# Patient Record
Sex: Female | Born: 1985 | Hispanic: Yes | Marital: Married | Smoking: Never smoker
Health system: Southern US, Community
[De-identification: ages and names within clinical notes are randomized; demographics above are authoritative.]

## PROBLEM LIST (undated history)

## (undated) DIAGNOSIS — O139 Gestational [pregnancy-induced] hypertension without significant proteinuria, unspecified trimester: Secondary | ICD-10-CM

## (undated) HISTORY — DX: Gestational (pregnancy-induced) hypertension without significant proteinuria, unspecified trimester: O13.9

---

## 2018-02-06 NOTE — L&D Delivery Note (Signed)
OB/GYN Faculty Practice Delivery Note  Emily Wiley is a 33 y.o. [id number] s/p VBAC at [redacted]w[redacted]d. She was admitted for IOL for pre-eclampsia with severe features.   ROM: 1h 53m with clear fluid GBS Status: Negative  Maximum Maternal Temperature: 100.0 F    Labor Progress: . Patient arrived at fingertip dilation and was induced with foley bulb and pitocin. She was on pitocin for approximately 28 hours at the time of delivery.  Delivery Date/Time: 10/10/2018 at 2226 Delivery: Called to room and patient was complete and pushing. Head delivered in LOA position. No nuchal cord present. Shoulder and body delivered in usual fashion. Infant with spontaneous cry, placed on mother's abdomen, dried and stimulated. Cord clamped x 2 after 1-minute delay, and cut by father. Cord blood drawn. Placenta delivered spontaneously with gentle cord traction. Fundus firm with massage and Pitocin. Labia, perineum, vagina, and cervix inspected with 2nd degree perineal laceration extending down to capsule of EAS without any obvious tear. A crown stitch was placed over the EAS and the 2nd degree perineal laceration was repaired in the usual fashion. After delivery of infant uterine tone was intermittently poor and there was significant uterine bleeding. She was given TXA and 466mcg buccal misoprostol with good effect.   Placenta: 3v, intact Complications: OB Hemorrhage Lacerations: 2nd degree perineal, repaired EBL: 1122 Analgesia: epidural   Infant: APGAR (1 MIN): 9   APGAR (5 MINS): 9   APGAR (10 MINS):    Weight: pending  Augustin Coupe, MD/[facility] OB/GYN Fellow, Faculty Practice

## 2018-03-18 LAB — OB RESULTS CONSOLE ABO/RH: RH Type: POSITIVE

## 2018-03-18 LAB — CULTURE, OB URINE
Drug Screen, Urine: NEGATIVE
Urine Culture, OB: <5000

## 2018-03-18 LAB — OB RESULTS CONSOLE HGB/HCT, BLOOD
HCT: 37 (ref 29–41)
Hemoglobin: 12.5

## 2018-03-18 LAB — CYTOLOGY - PAP: Pap: ABNORMAL — AB

## 2018-03-18 LAB — OB RESULTS CONSOLE VARICELLA ZOSTER ANTIBODY, IGG: Varicella: IMMUNE

## 2018-03-18 LAB — OB RESULTS CONSOLE GC/CHLAMYDIA
Chlamydia: NEGATIVE
Gonorrhea: NEGATIVE

## 2018-03-18 LAB — OB RESULTS CONSOLE PLATELET COUNT: Platelets: 340

## 2018-03-18 LAB — OB RESULTS CONSOLE RUBELLA ANTIBODY, IGM: Rubella: IMMUNE

## 2018-03-18 LAB — OB RESULTS CONSOLE RPR: RPR: NONREACTIVE

## 2018-03-18 LAB — TRICHOMONAS (~~LOC~~): Trichomonas: NEGATIVE

## 2018-03-18 LAB — OB RESULTS CONSOLE ANTIBODY SCREEN: Antibody Screen: NEGATIVE

## 2018-03-18 LAB — GLUCOSE, 1 HOUR: Glucose 1 Hour: 67

## 2018-03-18 LAB — OB RESULTS CONSOLE HEPATITIS B SURFACE ANTIGEN: Hepatitis B Surface Ag: NEGATIVE

## 2018-03-19 ENCOUNTER — Other Ambulatory Visit (HOSPITAL_COMMUNITY): Payer: Self-pay | Admitting: Family

## 2018-03-19 DIAGNOSIS — Z3682 Encounter for antenatal screening for nuchal translucency: Secondary | ICD-10-CM

## 2018-03-29 ENCOUNTER — Encounter: Payer: Self-pay | Admitting: Family Medicine

## 2018-04-08 ENCOUNTER — Encounter: Payer: Self-pay | Admitting: Obstetrics & Gynecology

## 2018-04-08 ENCOUNTER — Ambulatory Visit (INDEPENDENT_AMBULATORY_CARE_PROVIDER_SITE_OTHER): Payer: Self-pay | Admitting: Obstetrics & Gynecology

## 2018-04-08 ENCOUNTER — Ambulatory Visit: Payer: Self-pay | Admitting: Clinical

## 2018-04-08 VITALS — BP 96/65 | HR 67 | Ht 60.0 in | Wt 132.7 lb

## 2018-04-08 DIAGNOSIS — Z3A13 13 weeks gestation of pregnancy: Secondary | ICD-10-CM

## 2018-04-08 DIAGNOSIS — Z8759 Personal history of other complications of pregnancy, childbirth and the puerperium: Secondary | ICD-10-CM

## 2018-04-08 DIAGNOSIS — O09292 Supervision of pregnancy with other poor reproductive or obstetric history, second trimester: Secondary | ICD-10-CM | POA: Insufficient documentation

## 2018-04-08 DIAGNOSIS — Z3480 Encounter for supervision of other normal pregnancy, unspecified trimester: Secondary | ICD-10-CM

## 2018-04-08 DIAGNOSIS — Z98891 History of uterine scar from previous surgery: Secondary | ICD-10-CM | POA: Insufficient documentation

## 2018-04-08 DIAGNOSIS — O0990 Supervision of high risk pregnancy, unspecified, unspecified trimester: Secondary | ICD-10-CM | POA: Insufficient documentation

## 2018-04-08 MED ORDER — ASPIRIN 81 MG PO CHEW: 81.0000 mg | CHEWABLE_TABLET | Freq: Every day | ORAL | 1 refills | Status: AC

## 2018-04-08 NOTE — Patient Instructions (Signed)
AREA PEDIATRIC/FAMILY PRACTICE PHYSICIANS  Central/[place]outheast [place] [phone]) . [facility] Davy Pique, MD; Gwendlyn Deutscher, MD; Walker Kehr, MD; Andria Frames, MD; McDiarmid, MD; Dutch Quint, MD; Nori Riis, MD; Mingo Amber, [address] [place], [place] [address] o [phone] o Mon-Fri 8:30-12:30, 1:30-5:00 o Providers come to see babies at [facility] o Accepting Medicaid . [place] [facility] [place] providers who accept newborns: Dorthy Cooler, MD; Orland Mustard, MD; Stephanie Acre, MD o [place], Rena Lara, [place] [address] o [phone] o Mon-Fri 8:00-5:30 o Babies seen by providers at [facility] o Does NOT accept Medicaid o Please call early in hospitalization for appointment (limited availability)  . Mustard [place], MD o [address], [place], [place] [address] o [phone] o Mon, Tue, Thur, Fri 8:30-5:00, Wed 10:00-7:00 (closed 1-2pm) o Babies seen by [facility] providers o Accepting Medicaid . [place], MD o [place], [place], [place] [address] o [phone] o Mon-Fri 8:30-5:00, Sat 8:30-12:00 o Provider comes to see babies at [place] Medicaid o Must have been referred from current patients or contacted office prior to delivery . [place] [facility] ([place] for [facility]) Franne Forts, MD; Tamera Punt, MD; Doneen Poisson, MD; Fatima Sanger, MD; Wynetta Emery, MD; Jess Barters, MD; Tami Ribas, MD; Herbert Moors, MD; Derrell Lolling, MD; Dorothyann Peng, MD; Lucious Groves, NP; Baldo Ash, NP o [address], [place], [place] [address] o [phone] o Mon, Tue, Thur, Fri 8:30-5:30, Wed 9:30-5:30, Sat 8:30-12:30 o Babies seen by [facility] providers o Accepting Medicaid o Only accepting infants of first-time parents or siblings of current patients [facility] discharge coordinator will make follow-up appointment . Baltazar Najjar o [place] [address],  [place], [place]  [address] o [phone]   Fax - [phone] . [facility] o [address], [place], [place]  [address] o Phone - [phone]   Fax [phone] . [place], [place], [place], [place]  [address] o [phone]  East/Northeast [place] [phone]) . [facility] Reginal Lutes, MD; Jacklynn Ganong, MD; Torrie Mayers, MD; MD; Rosana Hoes, MD; Servando Salina, MD; Rose Fillers, MD; Rex Kras, MD; Corinna Capra, MD; Volney American, MD; Trilby Drummer, MD; Janann Colonel, MD; Jimmye Norman, [place], [place], [place] [address] o [phone] o Mon-Fri 8:30-5:00 (extended evenings Mon-Thur as needed), Sat-Sun 10:00-1:00 o Providers come to see babies at [place] Medicaid for families of first-time babies and families with all children in the household age 72 and under. Must register with office prior to making appointment (M-F only). . [place], NP; Tomi Bamberger, MD; Redmond School, MD; [place], [place] [address] [place], [place] [address] o [phone] o Mon-Fri 8:00-5:00 o Babies seen by providers at [facility] o Does NOT accept Medicaid/Commercial Insurance Only . [facility])  Marnee Guarneri, MD; Drema Dallas, MD; Montine Circle, MD; Vilma Prader, MD; Vanita Panda, MD; Alfonso Ramus, MD; Ruthann Cancer, MD; Roxanne Mins, MD; Rosalva Ferron, MD; Polly Cobia, MD o [address] [place], [place] [address] o [phone] o Mon-Fri 8:30-5:30, Sat (Oct.-Mar.) 9:00-1:00 o Babies seen by providers at [place] [phone]) . [facility] Peers, MD; Suzan Slick, MD o [address], [place],  [address] o [phone] o Mon-Fri 8:30-5:00, Sat 8:30-12:00 o Providers come to see babies at [facility] o Does NOT accept Medicaid . [facility] Ricci Barker, PA; Mannie Stabile, MD; [place], [place]; Nancy Fetter, MD; Moreen Fowler, [place],  [place], [place] [address] o [phone] o Mon-Fri 8:00-5:00 o Babies seen by providers at [facility] o Does NOT accept Medicaid o Only accepting babies of parents who are patients o Please call early in hospitalization for appointment (limited availability) . [facility] Pediatricians Blanca Friend, MD; Sharlene Motts, MD; Rod Can, MD; Warner Mccreedy, NP; Sabra Heck, MD; Ermalinda Memos, MD; Sharlett Iles, NP; Aurther Loft, MD; Jerrye Beavers, MD; Marcello Moores, MD; Berline Lopes, MD; Charolette Forward, MD o [address] [place], [place], [place] [address] o [phone] o Mon-Fri 8:00-5:00, Sat 9:00-12:00 o Providers come to see babies at [facility] o Does NOT accept [facility] [phone]) . [place] [facility] [place] providers accepting new patients: Dayna Ramus, NP; [place], [place], [place], [place] [address] o [phone] o Mon-Fri 8:00-5:00 o Babies seen by providers at [facility] o Does NOT accept Medicaid o Only accepting babies of parents who are patients o Please call early in hospitalization for appointment (limited availability) . [facility] Oswaldo Conroy, MD; Sheran Lawless, MD o [address] [place], [place] [address] o [phone] (press 1 to schedule appointment) o Mon-Fri 8:00-5:00 o Providers come to see babies at [facility] o Does NOT accept Medicaid . [facility] Jodi Mourning, MD o [address], [place], [place] [address] o [phone] o Mon-Fri 8:30-5:00 (lunch 12:30-1:00), extended hours by appointment only Wed 5:00-6:30 o Babies seen by [facility] providers o Accepting Medicaid . [place] at Evalyn Casco, MD; [place], MD; Ethlyn Gallery, MD o [place], [place], [place] [address] o [phone] o Mon-Fri 8:00-5:00 o Babies seen by [facility] providers o Does NOT accept Medicaid . Therapist, music at [place], MD; Yong Channel, MD; [place], [place] [address] [address] [place], [place]  [address] o [phone] o Mon-Fri 8:00-5:00 o Babies seen by [facility] providers o Does NOT accept Medicaid . [place], [place]; [place], [place]; [place], NP; Albertina Parr, MD; Frederic Jericho, MD; Ronney Lion, MD; Carlos Levering, NP; Jerelene Redden, NP; Tomasita Crumble, NP; Ronelle Nigh, NP; Corinna Lines, MD; [place], MD o [place]., [place], [place] [address] o [phone] o Mon-Fri 8:30-5:00, Sat 10:00-1:00 o Providers come to see babies at [facility] o Does NOT accept Medicaid o Free prenatal information session Tuesdays at 4:45pm . [facility] Porfirio Oar, MD; [place], [place]; [place], [place]; [place], [place]., [place] [address] o [phone] o Mon-Fri 7:30-5:30 o Babies seen by [facility] providers . [facility] o [address] [place], [place], [place]  [address] o [phone]   Fax - [phone]  [place] [phone] & [phone]) . [place], MD o [address] [place], [place] [address] o [phone] o Mon-Thur 8:00-6:00 o Providers come to see babies at [place] Medicaid . [place], NP; Melford Aase, MD; [place], [place]; [place], [place]., [place], [place] [address] o [phone] o Mon-Thur 7:30-7:30, Fri 7:30-4:30 o Babies seen by [facility] providers o Accepting Medicaid . [facility] Nyra Jabs, MD; Cristino Martes, NP; Gertie Baron, MD o [address], [place], [place] [address] o [phone] o Mon-Fri 8:30-5:00, Sat 8:30-12:00 o Providers come to see babies at [place] Medicaid o Must have "Meet & Greet" appointment at office prior to delivery . [place] ([place]) o [place],  MD; Juleen China, MD; Clydene Laming, [place] [address], [place], [place] [address] o [phone] o Mon-Wed 8:00-6:00, Thur-Fri 8:00-5:00, Sat 9:00-12:00 o Providers come to  see babies at [facility] o Does NOT accept Medicaid o Only accepting siblings of current patients . [facility] [place], [place], [place], [place]  [address] o [phone]   Fax [phone] . [place] at [place] [address], [place], [place]  [address] o [phone]   Fax - Morton Gorman [phone] & [phone]) . Therapist, music at [place], DO; [place], [place]., [place], [place] [address] o [phone] o Mon-Fri 7:00-5:00 o Babies seen by [facility] providers o Does NOT accept Medicaid . [place], MD; [place], [place]; [place], [place], [place], [place] [address] o [phone] o Mon-Fri 8:00-5:00 o Babies seen by [facility] providers o Accepting Medicaid . [place], MD; [place], [place]; [place], NP; [place], [place], [place], [place] [address] o [phone] o Mon-Fri 8:00-5:00 o Babies seen by providers at [place] [phone]) . [facility] [place], [place] o [address] [place], [place] [address] o [phone] o Mon-Fri 8:00-5:00 o Babies seen by [facility] providers o Does NOT accept Medicaid o Limited availability, please call early in hospitalization to schedule follow-up . [facility] Leilani Merl, PA; Maisie Fus, MD; [place], MD; [place], [place]; Jeannine Kitten, MD; [place], [place] [facility] [address], [place], [place] [address] o [phone] o Mon-Fri 8:30-5:00, Sat 9:00-12:00 o Babies seen by providers at [facility] o Accepting Medicaid o Please register online then schedule online or call office o [email or web address] . [place] ([place] at  [place]) Kristian Covey, NP; Dwyane Dee, MD; Leonidas Romberg, PA o [address] [place], [place], [place] [address] o [phone] o Mon-Fri 8:00-5:00 o Babies seen by providers at [place] o Accepting Medicaid . [place] ([facility] [employer]) Dairl Ponder, MD; Rayvon Char, NP; Melina Modena, MD o [address] [place], [place], [place] [address] o [phone] o Mon-Fri 8:00-5:30, Sat&Sun by appointment (phones open at 8:30) o Babies seen by [facility] providers o Accepting Medicaid o Must be a first-time baby or sibling of current patient . [place], [address], [place], [place]  [address] o [phone]   Fax - [phone]  [place] [phone] & [phone]) . [place], [place]; [place], [place]; Benjamine Mola, MD; [place], [place]; Harrell Lark, MD o [address] [place], [place] [address] o [phone] o Mon-Thur 8:00-7:00, Fri 8:00-5:00, Sat 8:00-12:00, Sun 9:00-12:00 o Babies seen by [facility] providers o Accepting Medicaid . [facility] at [facility], MD; Ruthann Cancer, MD; [facility], MD o [address], [place], [place] [address] o [phone] o Mon-Thur 8:00-5:00 o Babies seen by providers at [facility] o Accepting Medicaid . [facility] at [place], MD; Coe-Goins, MD; Amedeo Plenty, MD; Bobby Rumpf, MD; List, MD; Lavonia Drafts, MD; Ruthann Cancer, MD; Selinda Eon, MD; Audie Box, MD; Jim Like, MD; Christie Nottingham, MD; Hubbard Hartshorn, MD; Modena Nunnery, MD o [place]., [place], [place]  [address] o [phone] o Mon-Fri 8:00-5:30, Sat (Oct.-Mar.) 9:00-1:00 o Babies seen by providers at [facility] o Accepting Medicaid o Must fill out new patient packet, available online at [email or web address] . [place] ([facility]) Barnabas Lister, NP; Kenton Kingfisher, NP; Claiborne Billings, NP; Rolla Plate, MD;  [place], [place]; Carola Rhine, MD; Tyron Russell, MD; Delia Chimes, NP o [address], [place], [place] [address] o [phone] o Mon-Thur 8:00-5:30, Fri 8:00-5:00 o Babies seen by providers at [place] [phone]) . [place], [place]; [place], MD; Dennard Schaumann, MD; [place], [place] o [address] [place], [place] [address] o [phone] o Mon-Fri 8:00-5:00 o Babies seen by providers at [place] [phone]) . [place] at [place], [place]; Olen Pel, MD; [place], [place], [place], [place] [address] o [phone] o Mon-Fri 8:00-5:00 o Babies seen by providers at [facility] o Does NOT accept Medicaid o Limited appointment availability, please call early in hospitalization  . Therapist, music at [place], [place]; [place], [address] [address] [place], [place] [address] o [phone] o Mon-Fri 8:00-5:00 o Babies seen by [facility] providers o Does NOT accept Medicaid . [facility] Su Grand, MD; Guy Sandifer, MD; [place], [place]; [place], [place] [address], [place], [place] [address] o [phone] o Mon-Fri 8:00-5:00 o After hours clinic [facility] [place], [place] [address]) [phone] Mon-Fri 5:00-8:00, Sat 12:00-6:00, Sun 10:00-4:00 o Babies seen by [facility] providers o Accepting Medicaid . [place] at [facility] o [address], [place], [place]  [address] o [phone]   Fax - [phone]  [place] [phone]) . Therapist, music at [facility], MD o [address] [place], [place], [place] [address] o [phone] o Mon-Fri 8:00-5:00 o Babies seen by [facility] providers o Does NOT accept Medicaid . [employer] ([place] [facility] [place]) Bing Neighbors, MD o [address], [place], [place]  [address] o [phone] o Mon-Thur 8:00-7:00, Fri 8:00-5:00, Sat 8:00-12:00 o Babies seen by providers at [facility] o Accepting Medicaid - but does not have vaccinations in office (must be received elsewhere) o Limited availability, please call early in hospitalization  [place] ([address]) . [place], MD o [address], [place] [address] o [phone]  Fax [phone]  Childbirth Education Options: [facility] Department Classes:  Childbirth education classes can help you get ready for a positive parenting experience. You can also meet other expectant parents and get free stuff for your baby. Each class runs for five weeks on the same night and costs $45 for the mother-to-be and her support person. Medicaid covers the cost if you are eligible. Call [phone] to register. [facility] Childbirth Education:  [phone] or [phone] or [email or web address]  Baby & Me Class: Discuss newborn & infant parenting and family adjustment issues with other new mothers in a relaxed environment. Each week brings a new speaker or baby-centered activity. We encourage new mothers to join [place] every Thursday at 11:00am. Babies birth until crawling. No registration or fee. Daddy [employer]: This course offers Dads-to-be the tools and knowledge needed to feel confident on their journey to becoming new fathers. Experienced dads, who have been trained as coaches, teach dads-to-be how to  hold, comfort, diaper, swaddle and play with their infant while being able to support the new mom as well. A class for men taught by men. $25/dad Big Brother/Big Sister: Let your children share in the joy of a new brother or sister in this special class designed just for them. Class includes discussion about how families care for babies: swaddling, holding, diapering, safety as well as how they can be helpful in their new role. This class is designed for  children ages 20 to 95, but any age is welcome. Please register each child individually. $5/child  Mom Talk: This mom-led group offers support and connection to mothers as they journey through the adjustments and struggles of that sometimes overwhelming first year after the birth of a child. Tuesdays at 10:00am and Thursdays at 6:00pm. Babies welcome. No registration or fee. Breastfeeding Support Group: This group is a mother-to-mother support circle where moms have the opportunity to share their breastfeeding experiences. A Lactation Consultant is present for questions and concerns. Meets each Tuesday at 11:00am. No fee or registration. Breastfeeding Your Baby: Learn what to expect in the first days of breastfeeding your newborn.  This class will help you feel more confident with the skills needed to begin your breastfeeding experience. Many new mothers are concerned about breastfeeding after leaving the hospital. This class will also address the most common fears and challenges about breastfeeding during the first few weeks, months and beyond. (call for fee) Comfort Techniques and Tour: This 2 hour interactive class will provide you the opportunity to learn & practice hands-on techniques that can help relieve some of the discomfort of labor and encourage your baby to rotate toward the best position for birth. You and your partner will be able to try a variety of labor positions with birth balls and rebozos as well as practice breathing, relaxation, and visualization techniques. A tour of the [facility] is included with this class. $20 per registrant and support person Childbirth Class- Weekend Option: This class is a Weekend version of our Birth & Baby series. It is designed for parents who have a difficult time fitting several weeks of classes into their schedule. It covers the care of your newborn and the basics of labor and childbirth. It also includes a [place]  [facility] and lunch. The class is held two consecutive days: beginning on Friday evening from 6:30 - 8:30 p.m. and the next day, Saturday from 9 a.m. - 4 p.m. (call for fee) Doren Custard Class: Interested in a waterbirth?  This informational class will help you discover whether waterbirth is the right fit for you. Education about waterbirth itself, supplies you would need and how to assemble your support team is what you can expect from this class. Some obstetrical practices require this class in order to pursue a waterbirth. (Not all obstetrical practices offer waterbirth-check with your healthcare provider.) Register only the expectant mom, but you are encouraged to bring your partner to class! Required if planning waterbirth, no fee. Infant/Child CPR: Parents, grandparents, babysitters, and friends learn Cardio-Pulmonary Resuscitation skills for infants and children. You will also learn how to treat both conscious and unconscious choking in infants and children. This Family & Friends program does not offer certification. Register each participant individually to ensure that enough mannequins are available. (Call for fee) Grandparent Love: Expecting a grandbaby? This class is for you! Learn about the latest infant care and safety recommendations and ways to support your own child as  he or she transitions into the parenting role. Taught by Registered Nurses who are childbirth instructors, but most importantly...they are grandmothers too! $10/person. Childbirth Class- Natural Childbirth: This series of 5 weekly classes is for expectant parents who want to learn and practice natural methods of coping with the process of labor and childbirth. Relaxation, breathing, massage, visualization, role of the partner, and helpful positioning are highlighted. Participants learn how to be confident in their body's ability to give birth. This class will empower and help parents make informed decisions about their own  care. Includes discussion that will help new parents transition into the immediate postpartum period. [facility] is included. We suggest taking this class between 25-32 weeks, but it's only a recommendation. $75 per registrant and one support person or $30 Medicaid. Childbirth Class- 3 week Series: This option of 3 weekly classes helps you and your labor partner prepare for childbirth. Newborn care, labor & birth, cesarean birth, pain management, and comfort techniques are discussed and a [place] [facility] is included. The class meets at the same time, on the same day of the week for 3 consecutive weeks beginning with the starting date you choose. $60 for registrant and one support person.  Marvelous Multiples: Expecting twins, triplets, or more? This class covers the differences in labor, birth, parenting, and breastfeeding issues that face multiples' parents. NICU tour is included. Led by a Certified Childbirth Educator who is the mother of twins. No fee. Caring for Baby: This class is for expectant and adoptive parents who want to learn and practice the most up-to-date newborn care for their babies. Focus is on birth through the first six weeks of life. Topics include feeding, bathing, diapering, crying, umbilical cord care, circumcision care and safe sleep. Parents learn to recognize symptoms of illness and when to call the pediatrician. Register only the mom-to-be and your partner or support person can plan to come with you! $10 per registrant and support person Childbirth Class- online option: This online class offers you the freedom to complete a Birth and Baby series in the comfort of your own home. The flexibility of this option allows you to review sections at your own pace, at times convenient to you and your support people. It includes additional video information, animations, quizzes, and extended activities. Get organized with helpful  eClass tools, checklists, and trackers. Once you register online for the class, you will receive an email within a few days to accept the invitation and begin the class when the time is right for you. The content will be available to you for 60 days. $60 for 60 days of online access for you and your support people.  Local Doulas: Natural Baby Doulas [email or web address] Tel: [phone] [email or web address] [employer] [phone] [email or web address] The Labor Hassell Halim  (also do waterbirth tub rental) [phone] [email or web address] Triad Birth Doula [phone] [email or web address] [email or web address].fi Sacred Rhythms  [phone] https://[email or web address]-[email or web address]/ [employer] (PADA) [email or web address].[email or web address]/ [place] Birth and Baby  [email or web address] Considering Waterbirth? Guide for patients at Center for [employer]  Why consider waterbirth?  . Gentle birth for babies . Less pain medicine used in labor . May allow for passive descent/less pushing . May reduce perineal tears  . More mobility and instinctive maternal position changes . Increased maternal relaxation . Reduced blood pressure in labor  Is waterbirth safe? What are the risks of infection, drowning or other complications?  . Infection: o Very low risk (3.7 % for  vs 4.8% for bed) o 7 in 8000 waterbirths with documented infection o Poorly cleaned equipment most common cause o Slightly lower group B strep transmission rate  . Drowning o Maternal:  - Very low risk   - Related to seizures or fainting o Newborn:  - Very low risk. No evidence of increased risk of respiratory problems in multiple large studies - Physiological protection from breathing under water - Avoid underwater birth if there are any fetal  complications - Once baby's head is out of the water, keep it out.  . Birth complication o Some reports of cord trauma, but risk decreased by bringing baby to surface gradually o No evidence of increased risk of shoulder dystocia. Mothers can usually change positions faster in water than in a bed, possibly aiding the maneuvers to free the shoulder.   You must attend a Waterbirth class at [facility]  3rd Wednesday of every month from 7-9pm  Free  Register by calling [phone] or online at [email or web address]  Bring us the certificate from the class to your prenatal appointment  Meet with a midwife at 36 weeks to see if you can still plan a waterbirth and to sign the consent.   Purchase or rent the following supplies:   Water Birth Pool (Birth Pool in a Box or [place] for instance)  (Tubs start ~$125)  Single-use disposable tub liner designed for your brand of tub  New garden hose labeled "lead-free", "suitable for drinking water",  Electric drain pump to remove water (We recommend 792 gallon per hour or greater pump.)   Separate garden hose to remove the dirty water  Fish net  Bathing suit top (optional)  Long-handled mirror (optional)  Places to purchase or rent supplies  Yourwaterbirth.com for tub purchases and supplies  Waterbirthsolutions.com for tub purchases and supplies  [employer] (www.thelaborladies.com) $275 for tub rental/set-up & take down/kit   [place] [employer] (http://www.padanc.org/MeetUs.htm) Information regarding doulas (labor support) who provide pool rentals  Our practice has a Birth Pool in a Box tub at the hospital that you may borrow on a first-come-first-served basis. It is your responsibility to to set up, clean and break down the tub. We cannot guarantee the availability of this tub in advance. You are responsible for bringing all accessories listed above. If you do not have all necessary supplies you cannot  have a waterbirth.    Things that would prevent you from having a waterbirth:  Premature, <37wks  Previous cesarean birth  Presence of thick meconium-stained fluid  Multiple gestation (Twins, triplets, etc.)  Uncontrolled diabetes or gestational diabetes requiring medication  Hypertension requiring medication or diagnosis of pre-eclampsia  Heavy vaginal bleeding  Non-reassuring fetal heart rate  Active infection (MRSA, etc.). Group B Strep is NOT a contraindication for  waterbirth.  If your labor has to be induced and induction method requires continuous  monitoring of the baby's heart rate  Other risks/issues identified by your obstetrical provider  Please remember that birth is unpredictable. Under certain unforeseeable circumstances your provider may advise against giving birth in the tub. These decisions will be made on a case-by-case basis and with the safety of you and your baby as our highest priority.     

## 2018-04-08 NOTE — Progress Notes (Signed)
   PRENATAL VISIT NOTE  Subjective:  Emily Wiley is a 33 y.o. [id number] at [redacted]w[redacted]d being seen today for ongoing prenatal care.  She is currently monitored for the following issues for this low-risk pregnancy and has Supervision of other normal pregnancy, antepartum on their problem list.  She is transferring from the health dept.  Patient reports no complaints.  Contractions: Irritability. Vag. Bleeding: None.  Movement: Present. Denies leaking of fluid.   The following portions of the patient's history were reviewed and updated as appropriate: allergies, current medications, past family history, past medical history, past social history, past surgical history and problem list. Problem list updated.  Objective:   Vitals:   04/08/18 1525 04/08/18 1529  BP: 96/65   Pulse: 67   Weight: 132 lb 11.2 oz (60.2 kg)   Height:  5' (1.524 m)    Fetal Status: Fetal Heart Rate (bpm): 145   Movement: Present     General:  Alert, oriented and cooperative. Patient is in no acute distress.  Skin: Skin is warm and dry. No rash noted.   Cardiovascular: Normal heart rate noted  Respiratory: Normal respiratory effort, no problems with respiration noted  Abdomen: Soft, gravid, appropriate for gestational age.  Pain/Pressure: Present     Pelvic: Cervical exam deferred        Extremities: Normal range of motion.  Edema: None  Mental Status: Normal mood and affect. Normal behavior. Normal judgment and thought content.   Assessment and Plan:  Pregnancy: [id number] at [redacted]w[redacted]d  1. Supervision of other normal pregnancy, antepartum  - [place] MFM OB COMP + 14 WK; Future 2. H/O PLTCS for failed IOL at 36 weeks for pre eclampsia in [place] - start baby asa  Preterm labor symptoms and general obstetric precautions including but not limited to vaginal bleeding, contractions, leaking of fluid and fetal movement were reviewed in detail with the patient. Please refer to After Visit Summary for other counseling  recommendations.  No follow-ups on file.  Future Appointments  Date Time Provider Department Center  04/11/2018  8:00 AM WH-MFC [place] 3 WH-MFCUS MFC-US  04/11/2018  9:00 AM WH-MFC LAB WH-MFC MFC-US  05/20/2018  8:45 AM WH-MFC [place] 2 WH-MFCUS MFC-US    Allie Bossier, MD

## 2018-04-08 NOTE — BH Specialist Note (Signed)
Integrated Behavioral Health Initial Visit  MRN: [id number] Name: Emily Wiley  Number of Integrated Behavioral Health Clinician visits:: 1/6 Session Start time: 3:22 Session End time: 3:37 Total time: 15 minutes  Type of Service: Integrated Behavioral Health- Individual/Family Interpretor:Yes.   Interpretor Name and Language:  Spanish   Warm Hand Off Completed.       SUBJECTIVE: Emily Wiley is a 33 y.o. female accompanied by n/a Patient was referred by Nicholaus Bloom, MD for Initial OB introduction to integrated behavioral health services  Patient reports the following symptoms/concerns: Pt states her primary symptom today is feeling nausea in pregnancy; no other concern today Duration of problem: Current pregnancy; Severity of problem: mild  OBJECTIVE: Mood: Normal and Affect: Appropriate Risk of harm to self or others: No plan to harm self or others  LIFE CONTEXT: Family and Social: Pt lives with her husband and 9yo School/Work: Pt works full-time second shift hours Self-Care: - Life Changes: Current pregnancy  GOALS ADDRESSED: Patient will: 1. Increase knowledge and/or ability of: healthy habits  INTERVENTIONS: Interventions utilized: Psychoeducation and/or Health Education  Standardized Assessments completed: GAD-7 and PHQ 9  ASSESSMENT: Patient currently experiencing Supervision of other normal pregnancy, antepartum   Patient may benefit from Initial OB introduction to integrated behavioral health services .  PLAN: 1. Follow up with behavioral health clinician on : As needed 2. Behavioral recommendations:  -Take prenatal vitamin daily, as recommended by medical provider 3. Referral(s): Integrated [employer] (In Clinic) 4. "From scale of 1-10, how likely are you to follow plan?": 10  Deziree Mokry C Zehava Turski, LCSW

## 2018-04-09 ENCOUNTER — Encounter: Payer: Self-pay | Admitting: *Deleted

## 2018-04-11 ENCOUNTER — Encounter (HOSPITAL_COMMUNITY): Payer: Self-pay

## 2018-04-11 ENCOUNTER — Other Ambulatory Visit (HOSPITAL_COMMUNITY): Payer: Self-pay

## 2018-04-15 ENCOUNTER — Ambulatory Visit (INDEPENDENT_AMBULATORY_CARE_PROVIDER_SITE_OTHER): Payer: Self-pay | Admitting: *Deleted

## 2018-04-15 DIAGNOSIS — O09292 Supervision of pregnancy with other poor reproductive or obstetric history, second trimester: Secondary | ICD-10-CM

## 2018-04-15 NOTE — Progress Notes (Signed)
Per schedule here for 17p injection. She is only 14 weeks . Reviewed chart and discussed with lead RN Marylynn Pearson, RN does not meet criteria for 17p because per provider note has hx PTD secondary to IOL for preeclampsia at 36 weeks. Informed patient to keep ob appt as scheduled. She also asked what she could take for cold and I informed her of meds safe for cold in pregnancy.  Linda,RN

## 2018-04-19 NOTE — Progress Notes (Signed)
Patient ID: Emily Wiley, female   DOB: [birth year], 33 y.o.   MRN: [id number] I have reviewed the chart and agree with nursing staff's documentation of this patient's encounter.  Scheryl Darter, MD 04/19/2018 10:43 AM

## 2018-04-22 ENCOUNTER — Ambulatory Visit: Payer: Self-pay

## 2018-04-26 ENCOUNTER — Encounter: Payer: Self-pay | Admitting: *Deleted

## 2018-04-29 ENCOUNTER — Encounter: Payer: Self-pay | Admitting: *Deleted

## 2018-04-29 ENCOUNTER — Ambulatory Visit: Payer: Self-pay

## 2018-05-06 ENCOUNTER — Telehealth: Payer: Self-pay | Admitting: Obstetrics and Gynecology

## 2018-05-06 NOTE — Telephone Encounter (Signed)
Called the patient with the interupter ID [phone]. Informed the patient of the appointment restrictions due to the COVID19.

## 2018-05-07 ENCOUNTER — Other Ambulatory Visit: Payer: Self-pay

## 2018-05-07 ENCOUNTER — Ambulatory Visit (INDEPENDENT_AMBULATORY_CARE_PROVIDER_SITE_OTHER): Payer: Self-pay | Admitting: Family Medicine

## 2018-05-07 VITALS — BP 114/76 | HR 69 | Temp 98.0°F | Wt 131.4 lb

## 2018-05-07 DIAGNOSIS — Z3A17 17 weeks gestation of pregnancy: Secondary | ICD-10-CM

## 2018-05-07 DIAGNOSIS — O09292 Supervision of pregnancy with other poor reproductive or obstetric history, second trimester: Secondary | ICD-10-CM

## 2018-05-07 DIAGNOSIS — Z8759 Personal history of other complications of pregnancy, childbirth and the puerperium: Secondary | ICD-10-CM

## 2018-05-07 DIAGNOSIS — Z98891 History of uterine scar from previous surgery: Secondary | ICD-10-CM

## 2018-05-07 DIAGNOSIS — Z3480 Encounter for supervision of other normal pregnancy, unspecified trimester: Secondary | ICD-10-CM

## 2018-05-07 LAB — POCT URINALYSIS DIP (DEVICE)
Bilirubin Urine: NEGATIVE
Glucose, UA: NEGATIVE mg/dL
Hgb urine dipstick: NEGATIVE
Ketones, ur: NEGATIVE mg/dL
Leukocytes,Ua: NEGATIVE
Nitrite: NEGATIVE
PROTEIN: NEGATIVE mg/dL
Specific Gravity, Urine: 1.025 (ref 1.005–1.030)
Urobilinogen, UA: 0.2 mg/dL (ref 0.0–1.0)
pH: 7 (ref 5.0–8.0)

## 2018-05-07 NOTE — Progress Notes (Signed)
Pt does not have access to BP cuff at home and received non-Baby Scripts cuff during her visit today.  Pt set up for Baby-scripts optimization but d/t difficulty with the baby-scripts website, this did not occur until after her visit was complete.  Pt did receive full instructions on how to sign up via email and download the app to which she verbalized understanding. Eda used to interpret.

## 2018-05-07 NOTE — Progress Notes (Signed)
   PRENATAL VISIT NOTE  Subjective:  Emily Wiley is a 33 y.o. [id number] at [redacted]w[redacted]d being seen today for ongoing prenatal care.  She is currently monitored for the following issues for this high-risk pregnancy and has Supervision of other normal pregnancy, antepartum; History of cesarean delivery; Hx of preeclampsia, prior pregnancy, currently pregnant, second trimester; and History of preterm premature rupture of membranes (PPROM) on their problem list.  Patient reports bilateral hip pain.  Contractions: Not present. Vag. Bleeding: None.  Movement: Present. Denies leaking of fluid.   The following portions of the patient's history were reviewed and updated as appropriate: allergies, current medications, past family history, past medical history, past social history, past surgical history and problem list.   Objective:   Vitals:   05/07/18 0946  BP: 114/76  Pulse: 69  Temp: 98 F (36.7 C)  Weight: 131 lb 6.4 oz (59.6 kg)    Fetal Status: Fetal Heart Rate (bpm): 143   Movement: Present     General:  Alert, oriented and cooperative. Patient is in no acute distress.  Skin: Skin is warm and dry. No rash noted.   Cardiovascular: Normal heart rate noted  Respiratory: Normal respiratory effort, no problems with respiration noted  Abdomen: Soft, gravid, appropriate for gestational age.  Pain/Pressure: Present     Pelvic: Cervical exam deferred        Extremities: Normal range of motion.  Edema: None  Mental Status: Normal mood and affect. Normal behavior. Normal judgment and thought content.   Assessment and Plan:  Pregnancy: [id number] at [redacted]w[redacted]d  1. Supervision of other normal pregnancy, antepartum FHT and FH normal, [place] scheduled   2. Hx of preeclampsia, prior pregnancy, currently pregnant, second trimester Taking ASA  3. History of cesarean delivery  4. History of preterm premature rupture of membranes (PPROM) Discussed Makena, that once highly recommended, but recent studies  show that may not be as effective. Patient declined makena at this point.   Preterm labor symptoms and general obstetric precautions including but not limited to vaginal bleeding, contractions, leaking of fluid and fetal movement were reviewed in detail with the patient. Please refer to After Visit Summary for other counseling recommendations.   No follow-ups on file.  No future appointments.  Levie Heritage, DO

## 2018-05-20 ENCOUNTER — Other Ambulatory Visit (HOSPITAL_COMMUNITY): Payer: Self-pay

## 2018-06-06 ENCOUNTER — Encounter: Payer: Self-pay | Admitting: Obstetrics & Gynecology

## 2018-06-06 ENCOUNTER — Ambulatory Visit (INDEPENDENT_AMBULATORY_CARE_PROVIDER_SITE_OTHER): Payer: Self-pay | Admitting: Obstetrics & Gynecology

## 2018-06-06 ENCOUNTER — Other Ambulatory Visit: Payer: Self-pay

## 2018-06-06 DIAGNOSIS — Z3A21 21 weeks gestation of pregnancy: Secondary | ICD-10-CM

## 2018-06-06 DIAGNOSIS — Z3482 Encounter for supervision of other normal pregnancy, second trimester: Secondary | ICD-10-CM

## 2018-06-06 DIAGNOSIS — Z3480 Encounter for supervision of other normal pregnancy, unspecified trimester: Secondary | ICD-10-CM

## 2018-06-06 NOTE — Progress Notes (Signed)
Pt. Has BP Cuff, could not take BP due to being at work. Advised to take & record in BRx when she gets home, pt verbalized understanding.

## 2018-06-06 NOTE — Progress Notes (Signed)
Pt stated that she was not able to use Webex because she was at work & [employer] on work computer, nor her phone. So appointment was thru telephone w/ live Interpreter.

## 2018-06-06 NOTE — Progress Notes (Signed)
   TELEHEALTH VIRTUAL OBSTETRICS VISIT ENCOUNTER NOTE  I connected with Emily Jillienne on 06/06/18 at  1:15 PM EDT by telephone at home and verified that I am speaking with the correct person using two identifiers.   I discussed the limitations, risks, security and privacy concerns of performing an evaluation and management service by telephone and the availability of in person appointments. I also discussed with the patient that there may be a patient responsible charge related to this service. The patient expressed understanding and agreed to proceed.  Subjective:  Emily Wiley is a 33 y.o. G3P1011 at [redacted]w[redacted]d being followed for ongoing prenatal care.  She is currently monitored for the following issues for this high-risk pregnancy and has Supervision of other normal pregnancy, antepartum; History of cesarean delivery; Hx of preeclampsia, prior pregnancy, currently pregnant, second trimester; and History of preterm premature rupture of membranes (PPROM) on their problem list.  Patient reports no complaints. Reports fetal movement. Denies any contractions, bleeding or leaking of fluid.   The following portions of the patient's history were reviewed and updated as appropriate: allergies, current medications, past family history, past medical history, past social history, past surgical history and problem list.   Objective:   General:  Alert, oriented and cooperative.   Mental Status: Normal mood and affect perceived. Normal judgment and thought content.  Rest of physical exam deferred due to type of encounter  Assessment and Plan:  Pregnancy: G3P1011 at [redacted]w[redacted]d 1. Supervision of other normal pregnancy, antepartum   Preterm labor symptoms and general obstetric precautions including but not limited to vaginal bleeding, contractions, leaking of fluid and fetal movement were reviewed in detail with the patient.  I discussed the assessment and treatment plan with the patient. The  patient was provided an opportunity to ask questions and all were answered. The patient agreed with the plan and demonstrated an understanding of the instructions. The patient was advised to call back or seek an in-person office evaluation/go to [facility]U at [facility] for any urgent or concerning symptoms. Please refer to After Visit Summary for other counseling recommendations.   I provided 10 minutes of non-face-to-face time during this encounter. Interpreter assisted with the call  Return in about 6 weeks (around 07/18/2018) for 2 hr.  No future appointments.  Scheryl Darter, MD Center for [facility], [facility]

## 2018-06-06 NOTE — Patient Instructions (Signed)
Segundo trimestre de embarazo Second Trimester of Pregnancy  El segundo trimestre va desde la semana14 hasta la 27 (desde el mes 4 hasta el 6). Este suele ser el momento en el que mejor se siente. En general, las nuseas matutinas han disminuido o han desaparecido completamente. Tendr ms energa y podr aumentarle el apetito. El beb en gestacin se desarrolla rpidamente. Hacia el final del sexto mes, el beb mide aproximadamente 9 pulgadas (23 cm) y pesa alrededor de 1 libras (700 g). Es probable que sienta al beb moverse entre las 18 y 20 semanas del embarazo. Siga estas indicaciones en su casa: Medicamentos  Tome los medicamentos de venta libre y los recetados solamente como se lo haya indicado el mdico. Algunos medicamentos son seguros para tomar durante el embarazo y otros no lo son.  Tome vitaminas prenatales que contengan por lo menos 600microgramos (?g) de cido flico.  Si tiene dificultad para mover el intestino (estreimiento), tome un medicamento para ablandar las heces (laxante) si su mdico se lo autoriza. Comida y bebida   Ingiera alimentos saludables de manera regular.  No coma carne cruda ni quesos sin cocinar.  Si obtiene poca cantidad de calcio de los alimentos que ingiere, consulte a su mdico sobre la posibilidad de tomar un suplemento diario de calcio.  Evite el consumo de alimentos ricos en grasas y azcares, como los alimentos fritos y los dulces.  Si tiene malestar estomacal (nuseas) o devuelve (vomita): ? Ingiera 4 o 5comidas pequeas por da en lugar de 3abundantes. ? Intente comer algunas galletitas saladas. ? Beba lquidos entre las comidas, en lugar de hacerlo durante estas.  Para evitar el estreimiento: ? Consuma alimentos ricos en fibra, como frutas y verduras frescas, cereales integrales y frijoles. ? Beba suficiente lquido para mantener el pis (orina) claro o de color amarillo plido. Actividad  Haga ejercicios solamente como se lo haya  indicado el mdico. Interrumpa la actividad fsica si comienza a tener calambres.  No haga ejercicio si hace demasiado calor, hay demasiada humedad o se encuentra en un lugar de mucha altura (altitud alta).  Evite levantar pesos excesivos.  Use zapatos con tacones bajos. Mantenga una buena postura al sentarse y pararse.  Puede continuar teniendo relaciones sexuales, a menos que el mdico le indique lo contrario. Alivio del dolor y del malestar  Use un sostn que le brinde buen soporte si sus mamas estn sensibles.  Dese baos de asiento con agua tibia para aliviar el dolor o las molestias causadas por las hemorroides. Use una crema para las hemorroides si el mdico la autoriza.  Descanse con las piernas elevadas si tiene calambres o dolor de cintura.  Si desarrolla venas hinchadas y abultadas (vrices) en las piernas: ? Use medias de compresin o medias de descanso como se lo haya indicado el mdico. ? Levante (eleve) los pies durante 15minutos, 3 o 4veces por da. ? Limite el consumo de sal en sus alimentos. Cuidado prenatal  Escriba sus preguntas. Llvelas cuando concurra a las visitas prenatales.  Concurra a todas las visitas prenatales como se lo haya indicado el mdico. Esto es importante. Seguridad  Colquese el cinturn de seguridad cuando conduzca.  Haga una lista de los nmeros de telfono de emergencia, que incluya los nmeros de telfono de familiares, amigos, el hospital, as como los departamentos de polica y bomberos. Instrucciones generales  Consulte a su mdico sobre los alimentos que debe comer o pdale que la ayude a encontrar a quien pueda aconsejarla si necesita ese servicio.    Consulte a su mdico acerca de dnde se dictan clases prenatales cerca de donde vive. Comience las clases antes del mes 6 de embarazo.  No se d baos de inmersin en agua caliente, baos turcos ni saunas.  No se haga duchas vaginales ni use tampones o toallas higinicas perfumadas.   No mantenga las piernas cruzadas durante mucho tiempo.  Vaya al dentista si an no lo hizo. Use un cepillo de cerdas suaves para cepillarse los dientes. Psese el hilo dental suavemente.  No fume, no consuma hierbas ni beba alcohol. No tome frmacos que el mdico no haya autorizado.  No consuma ningn producto que contenga nicotina o tabaco, como cigarrillos y cigarrillos electrnicos. Si necesita ayuda para dejar de fumar, consulte al mdico.  Evite el contacto con las bandejas sanitarias de los gatos y la tierra que estos animales usan. Estos elementos contienen bacterias que pueden causar defectos congnitos al beb y la posible prdida del beb (aborto espontneo) o la muerte fetal. Comunquese con un mdico si:  Tiene clicos leves o siente presin en la parte baja del vientre.  Tiene dolor al hacer pis (orinar).  Advierte un lquido con olor ftido que proviene de la vagina.  Tiene malestar estomacal (nuseas), devuelve (vomita) o tiene deposiciones acuosas (diarrea).  Sufre un dolor persistente en el abdomen.  Siente mareos. Solicite ayuda de inmediato si:  Tiene fiebre.  Tiene una prdida de lquido por la vagina.  Tiene sangrado o pequeas prdidas vaginales.  Siente dolor intenso o clicos en el abdomen.  Sube o baja de peso rpidamente.  Tiene dificultades para recuperar el aliento y siente dolor en el pecho.  Sbitamente se le hinchan mucho el rostro, las manos, los tobillos, los pies o las piernas.  No ha sentido los movimientos del beb durante una hora.  Siente un dolor de cabeza intenso que no se alivia al tomar medicamentos.  Tiene dificultad para ver. Resumen  El segundo trimestre va desde la semana14 hasta la 27, desde el mes 4 hasta el 6. Este suele ser el momento en el que mejor se siente.  Para cuidarse y cuidar a su beb en gestacin, debe comer alimentos saludables, tomar medicamentos solamente si su mdico le indica que lo haga y hacer  actividades que sean seguras para usted y su beb.  Llame al mdico si se enferma o si nota algo inusual acerca de su embarazo. Tambin llame al mdico si necesita ayuda para saber qu alimentos debe comer o si quiere saber qu actividades puede realizar de forma segura. Esta informacin no tiene como fin reemplazar el consejo del mdico. Asegrese de hacerle al mdico cualquier pregunta que tenga. Document Released: 09/25/2012 Document Revised: 10/18/2016 Document Reviewed: 10/18/2016 Elsevier Interactive Patient Education  2019 Elsevier Inc.  

## 2018-06-14 ENCOUNTER — Ambulatory Visit (HOSPITAL_COMMUNITY): Payer: Self-pay

## 2018-06-18 ENCOUNTER — Other Ambulatory Visit (HOSPITAL_COMMUNITY): Payer: Self-pay | Admitting: *Deleted

## 2018-06-18 ENCOUNTER — Other Ambulatory Visit: Payer: Self-pay | Admitting: Obstetrics & Gynecology

## 2018-06-18 ENCOUNTER — Encounter (HOSPITAL_COMMUNITY): Payer: Self-pay

## 2018-06-18 ENCOUNTER — Ambulatory Visit (HOSPITAL_COMMUNITY): Payer: Self-pay | Admitting: *Deleted

## 2018-06-18 ENCOUNTER — Ambulatory Visit (HOSPITAL_COMMUNITY)
Admission: RE | Admit: 2018-06-18 | Discharge: 2018-06-18 | Disposition: A | Discharge status: Home / Self Care | Payer: Self-pay | Source: Ambulatory Visit | Attending: Obstetrics and Gynecology | Admitting: Obstetrics and Gynecology

## 2018-06-18 ENCOUNTER — Other Ambulatory Visit: Payer: Self-pay

## 2018-06-18 VITALS — BP 118/56 | HR 86 | Temp 98.5°F

## 2018-06-18 DIAGNOSIS — Z3480 Encounter for supervision of other normal pregnancy, unspecified trimester: Secondary | ICD-10-CM

## 2018-06-18 DIAGNOSIS — O365990 Maternal care for other known or suspected poor fetal growth, unspecified trimester, not applicable or unspecified: Secondary | ICD-10-CM

## 2018-06-18 DIAGNOSIS — O0990 Supervision of high risk pregnancy, unspecified, unspecified trimester: Secondary | ICD-10-CM

## 2018-06-18 DIAGNOSIS — O09292 Supervision of pregnancy with other poor reproductive or obstetric history, second trimester: Secondary | ICD-10-CM

## 2018-06-18 DIAGNOSIS — O365920 Maternal care for other known or suspected poor fetal growth, second trimester, not applicable or unspecified: Secondary | ICD-10-CM

## 2018-06-18 DIAGNOSIS — O34219 Maternal care for unspecified type scar from previous cesarean delivery: Secondary | ICD-10-CM

## 2018-06-18 DIAGNOSIS — Z3A23 23 weeks gestation of pregnancy: Secondary | ICD-10-CM

## 2018-06-25 ENCOUNTER — Other Ambulatory Visit (HOSPITAL_COMMUNITY): Payer: Self-pay | Admitting: *Deleted

## 2018-06-25 ENCOUNTER — Other Ambulatory Visit: Payer: Self-pay

## 2018-06-25 ENCOUNTER — Ambulatory Visit (HOSPITAL_COMMUNITY)
Admission: RE | Admit: 2018-06-25 | Discharge: 2018-06-25 | Disposition: A | Discharge status: Home / Self Care | Payer: Self-pay | Source: Ambulatory Visit | Attending: Obstetrics and Gynecology | Admitting: Obstetrics and Gynecology

## 2018-06-25 ENCOUNTER — Ambulatory Visit (HOSPITAL_COMMUNITY): Payer: Self-pay | Admitting: *Deleted

## 2018-06-25 ENCOUNTER — Encounter (HOSPITAL_COMMUNITY): Payer: Self-pay

## 2018-06-25 VITALS — BP 114/67 | HR 79 | Temp 98.4°F

## 2018-06-25 DIAGNOSIS — O09292 Supervision of pregnancy with other poor reproductive or obstetric history, second trimester: Secondary | ICD-10-CM

## 2018-06-25 DIAGNOSIS — O365990 Maternal care for other known or suspected poor fetal growth, unspecified trimester, not applicable or unspecified: Secondary | ICD-10-CM | POA: Insufficient documentation

## 2018-06-25 DIAGNOSIS — O0990 Supervision of high risk pregnancy, unspecified, unspecified trimester: Secondary | ICD-10-CM | POA: Insufficient documentation

## 2018-06-25 DIAGNOSIS — Z3A24 24 weeks gestation of pregnancy: Secondary | ICD-10-CM

## 2018-06-25 DIAGNOSIS — O365920 Maternal care for other known or suspected poor fetal growth, second trimester, not applicable or unspecified: Secondary | ICD-10-CM

## 2018-06-25 DIAGNOSIS — O34219 Maternal care for unspecified type scar from previous cesarean delivery: Secondary | ICD-10-CM

## 2018-06-26 ENCOUNTER — Other Ambulatory Visit (HOSPITAL_COMMUNITY): Payer: Self-pay | Admitting: Maternal & Fetal Medicine

## 2018-07-09 ENCOUNTER — Encounter (HOSPITAL_COMMUNITY): Payer: Self-pay

## 2018-07-09 ENCOUNTER — Other Ambulatory Visit: Payer: Self-pay

## 2018-07-09 ENCOUNTER — Ambulatory Visit (HOSPITAL_COMMUNITY): Payer: Self-pay | Admitting: *Deleted

## 2018-07-09 ENCOUNTER — Ambulatory Visit (HOSPITAL_COMMUNITY)
Admission: RE | Admit: 2018-07-09 | Discharge: 2018-07-09 | Disposition: A | Discharge status: Home / Self Care | Payer: Self-pay | Source: Ambulatory Visit | Attending: Obstetrics and Gynecology | Admitting: Obstetrics and Gynecology

## 2018-07-09 ENCOUNTER — Other Ambulatory Visit (HOSPITAL_COMMUNITY): Payer: Self-pay | Admitting: *Deleted

## 2018-07-09 DIAGNOSIS — Z98891 History of uterine scar from previous surgery: Secondary | ICD-10-CM | POA: Insufficient documentation

## 2018-07-09 DIAGNOSIS — O365990 Maternal care for other known or suspected poor fetal growth, unspecified trimester, not applicable or unspecified: Secondary | ICD-10-CM

## 2018-07-09 DIAGNOSIS — Z362 Encounter for other antenatal screening follow-up: Secondary | ICD-10-CM

## 2018-07-09 DIAGNOSIS — O34219 Maternal care for unspecified type scar from previous cesarean delivery: Secondary | ICD-10-CM

## 2018-07-09 DIAGNOSIS — Z3A26 26 weeks gestation of pregnancy: Secondary | ICD-10-CM

## 2018-07-09 DIAGNOSIS — Z3480 Encounter for supervision of other normal pregnancy, unspecified trimester: Secondary | ICD-10-CM

## 2018-07-09 DIAGNOSIS — O365920 Maternal care for other known or suspected poor fetal growth, second trimester, not applicable or unspecified: Secondary | ICD-10-CM

## 2018-07-09 DIAGNOSIS — O09292 Supervision of pregnancy with other poor reproductive or obstetric history, second trimester: Secondary | ICD-10-CM

## 2018-07-11 ENCOUNTER — Ambulatory Visit (HOSPITAL_COMMUNITY): Payer: Self-pay

## 2018-07-17 ENCOUNTER — Other Ambulatory Visit: Payer: Self-pay | Admitting: *Deleted

## 2018-07-17 DIAGNOSIS — Z3480 Encounter for supervision of other normal pregnancy, unspecified trimester: Secondary | ICD-10-CM

## 2018-07-18 ENCOUNTER — Other Ambulatory Visit: Payer: Self-pay

## 2018-07-18 ENCOUNTER — Encounter: Payer: Self-pay | Admitting: Obstetrics & Gynecology

## 2018-07-18 ENCOUNTER — Telehealth: Payer: Self-pay | Admitting: Obstetrics & Gynecology

## 2018-07-18 NOTE — Telephone Encounter (Signed)
Called the patient to inform of the missed appointment. Left a detailed voicemail and sending a missed appointment letter.

## 2018-07-24 ENCOUNTER — Telehealth: Payer: Self-pay | Admitting: Medical

## 2018-07-24 NOTE — Telephone Encounter (Signed)
The patient called in stating she was told the visit would be virtual. She waited for the visit however no one ever called. Informed the patient of the visit being face to face.   Informed the patient of the rescheduled appointment. Also informed of wearing a mask, sanitizing hands at the sanitizer station, no visitor or children are allowed to visit the office due to the [place] restrictions.  The appointment is scheduled a little more than a week out as there were no available slots. Scheduled in an 11:15 as there were no other available slots. An non-english speaking patient is scheduled for 10:35.

## 2018-08-05 ENCOUNTER — Encounter: Payer: Self-pay | Admitting: Obstetrics and Gynecology

## 2018-08-05 ENCOUNTER — Other Ambulatory Visit: Payer: Self-pay

## 2018-08-05 ENCOUNTER — Ambulatory Visit (INDEPENDENT_AMBULATORY_CARE_PROVIDER_SITE_OTHER): Payer: Self-pay | Admitting: Obstetrics and Gynecology

## 2018-08-05 VITALS — BP 117/73 | HR 68 | Temp 97.8°F | Wt 153.0 lb

## 2018-08-05 DIAGNOSIS — Z3480 Encounter for supervision of other normal pregnancy, unspecified trimester: Secondary | ICD-10-CM

## 2018-08-05 DIAGNOSIS — Z3A28 28 weeks gestation of pregnancy: Secondary | ICD-10-CM

## 2018-08-05 DIAGNOSIS — Z98891 History of uterine scar from previous surgery: Secondary | ICD-10-CM

## 2018-08-05 DIAGNOSIS — Z8759 Personal history of other complications of pregnancy, childbirth and the puerperium: Secondary | ICD-10-CM

## 2018-08-05 DIAGNOSIS — O09292 Supervision of pregnancy with other poor reproductive or obstetric history, second trimester: Secondary | ICD-10-CM

## 2018-08-05 DIAGNOSIS — O09293 Supervision of pregnancy with other poor reproductive or obstetric history, third trimester: Secondary | ICD-10-CM

## 2018-08-05 DIAGNOSIS — Z23 Encounter for immunization: Secondary | ICD-10-CM

## 2018-08-05 MED ORDER — ABDOMINAL SUPPORT/L-XL MISC: 1.0000 | 0 refills | Status: AC | PRN

## 2018-08-05 NOTE — Progress Notes (Signed)
Subjective:  Emily Wiley is a 33 y.o. G3P1011 at [redacted]w[redacted]d being seen today for ongoing prenatal care.  She is currently monitored for the following issues for this high-risk pregnancy and has Supervision of other normal pregnancy, antepartum; History of cesarean delivery; Hx of preeclampsia, prior pregnancy, currently pregnant, second trimester; and History of preterm premature rupture of membranes (PPROM) on their problem list.  Patient reports general pregnancy discomforts, occ urinary incont.  Contractions: Not present. Vag. Bleeding: None.  Movement: Present. Denies leaking of fluid.   The following portions of the patient's history were reviewed and updated as appropriate: allergies, current medications, past family history, past medical history, past social history, past surgical history and problem list. Problem list updated.  Objective:   Vitals:   08/05/18 1106  BP: 117/73  Pulse: 68  Temp: 97.8 F (36.6 C)  Weight: 153 lb (69.4 kg)    Fetal Status: Fetal Heart Rate (bpm): 135   Movement: Present     General:  Alert, oriented and cooperative. Patient is in no acute distress.  Skin: Skin is warm and dry. No rash noted.   Cardiovascular: Normal heart rate noted  Respiratory: Normal respiratory effort, no problems with respiration noted  Abdomen: Soft, gravid, appropriate for gestational age. Pain/Pressure: Present     Pelvic:  Cervical exam deferred        Extremities: Normal range of motion.  Edema: None  Mental Status: Normal mood and affect. Normal behavior. Normal judgment and thought content.   Urinalysis:      Assessment and Plan:  Pregnancy: G3P1011 at [redacted]w[redacted]d  1. Supervision of other normal pregnancy, antepartum Stable Glucola today - Tdap vaccine greater than or equal to 7yo IM  2. Hx of preeclampsia, prior pregnancy, currently pregnant, second trimester Stable Continue with BASA  3. History of preterm premature rupture of membranes (PPROM)   4.  History of cesarean delivery See problem note details Desires TOLAC consent obtained  Preterm labor symptoms and general obstetric precautions including but not limited to vaginal bleeding, contractions, leaking of fluid and fetal movement were reviewed in detail with the patient. Please refer to After Visit Summary for other counseling recommendations.  Return in about 4 weeks (around 09/02/2018) for OB visit, virtual.   Chancy Milroy, MD

## 2018-08-05 NOTE — Progress Notes (Signed)
Raquel, in-house spanish interpreter helping interpret for visit. TOLAC consent signed

## 2018-08-07 LAB — GLUCOSE TOLERANCE, 2 HOURS W/ 1HR
Glucose, 1 hour: 91 mg/dL (ref 65–179)
Glucose, 2 hour: 119 mg/dL (ref 65–152)
Glucose, Fasting: 100 mg/dL — ABNORMAL HIGH (ref 65–91)

## 2018-08-07 LAB — CBC
Hematocrit: 33.1 % — ABNORMAL LOW (ref 34.0–46.6)
Hemoglobin: 11 g/dL — ABNORMAL LOW (ref 11.1–15.9)
MCH: 28.7 pg (ref 26.6–33.0)
MCHC: 33.2 g/dL (ref 31.5–35.7)
MCV: 86 fL (ref 79–97)
Platelets: 296 10*3/uL (ref 150–450)
RBC: 3.83 x10E6/uL (ref 3.77–5.28)
RDW: 12.3 % (ref 11.7–15.4)
WBC: 10 10*3/uL (ref 3.4–10.8)

## 2018-08-07 LAB — HIV ANTIBODY (ROUTINE TESTING W REFLEX): HIV Screen 4th Generation wRfx: NONREACTIVE

## 2018-08-07 LAB — RPR: RPR Ser Ql: NONREACTIVE

## 2018-08-13 ENCOUNTER — Telehealth: Payer: Self-pay | Admitting: *Deleted

## 2018-08-13 ENCOUNTER — Encounter: Payer: Self-pay | Admitting: *Deleted

## 2018-08-13 DIAGNOSIS — Z3480 Encounter for supervision of other normal pregnancy, unspecified trimester: Secondary | ICD-10-CM

## 2018-08-13 NOTE — Telephone Encounter (Signed)
-----   Message from Tarry Kos sent at 08/12/2018  4:32 PM EDT -----  ----- Message ----- From: Woodroe Mode, MD Sent: 08/07/2018   4:58 PM EDT To: [facility]  FBS was abnormal with low post load values. Please have her do a FBS asap

## 2018-08-13 NOTE — Telephone Encounter (Signed)
Called pt to advise her that Dr. Roselie Awkward wanted her to come for a fasting blood sugar.  Pt did not pick up.  Left message requesting pt contact the clinic.  Eda used to interpret.

## 2018-08-14 NOTE — Telephone Encounter (Signed)
With Eda R., called pt and informed pt that the provider would like to have her come in for a FBS asap due to low post load values.  Pt stated that she is currently sick. I informed the pt that I would recommend that she does not come into the office however to contact the office as soon as she is better.  I also informed the pt that I will notified the provider and if there is something that he wants to do differently then we would give her a call.  Pt verbalized understanding.

## 2018-08-27 ENCOUNTER — Ambulatory Visit (HOSPITAL_COMMUNITY)
Admission: RE | Admit: 2018-08-27 | Discharge: 2018-08-27 | Disposition: A | Discharge status: Home / Self Care | Payer: Self-pay | Source: Ambulatory Visit | Attending: Obstetrics and Gynecology | Admitting: Obstetrics and Gynecology

## 2018-08-27 ENCOUNTER — Other Ambulatory Visit: Payer: Self-pay

## 2018-08-27 ENCOUNTER — Ambulatory Visit (HOSPITAL_COMMUNITY): Payer: Self-pay | Admitting: *Deleted

## 2018-08-27 DIAGNOSIS — Z98891 History of uterine scar from previous surgery: Secondary | ICD-10-CM | POA: Insufficient documentation

## 2018-08-27 DIAGNOSIS — O09293 Supervision of pregnancy with other poor reproductive or obstetric history, third trimester: Secondary | ICD-10-CM

## 2018-08-27 DIAGNOSIS — O34219 Maternal care for unspecified type scar from previous cesarean delivery: Secondary | ICD-10-CM

## 2018-08-27 DIAGNOSIS — O365990 Maternal care for other known or suspected poor fetal growth, unspecified trimester, not applicable or unspecified: Secondary | ICD-10-CM | POA: Insufficient documentation

## 2018-08-27 DIAGNOSIS — Z362 Encounter for other antenatal screening follow-up: Secondary | ICD-10-CM

## 2018-08-27 DIAGNOSIS — Z3480 Encounter for supervision of other normal pregnancy, unspecified trimester: Secondary | ICD-10-CM

## 2018-08-27 DIAGNOSIS — Z3A33 33 weeks gestation of pregnancy: Secondary | ICD-10-CM

## 2018-08-27 DIAGNOSIS — O365930 Maternal care for other known or suspected poor fetal growth, third trimester, not applicable or unspecified: Secondary | ICD-10-CM

## 2018-08-28 ENCOUNTER — Other Ambulatory Visit (HOSPITAL_COMMUNITY): Payer: Self-pay | Admitting: *Deleted

## 2018-08-28 DIAGNOSIS — O09293 Supervision of pregnancy with other poor reproductive or obstetric history, third trimester: Secondary | ICD-10-CM

## 2018-08-30 ENCOUNTER — Telehealth: Payer: Self-pay | Admitting: Family Medicine

## 2018-08-30 NOTE — Telephone Encounter (Signed)
Called the patient to confirm the mychart visit scheduled for tomorrow. Informed the patient of being signed into the virtual visit 15 minutes prior to time. Informed the patient of how to download the mychart app and ensured the patient could log in and access the appointment. Patient verbalized understanding.  Interpreter Id [phone]

## 2018-09-02 ENCOUNTER — Other Ambulatory Visit: Payer: Self-pay

## 2018-09-02 ENCOUNTER — Ambulatory Visit (INDEPENDENT_AMBULATORY_CARE_PROVIDER_SITE_OTHER): Payer: Self-pay | Admitting: Obstetrics & Gynecology

## 2018-09-02 VITALS — BP 112/81 | HR 67

## 2018-09-02 DIAGNOSIS — R7309 Other abnormal glucose: Secondary | ICD-10-CM

## 2018-09-02 DIAGNOSIS — Z3A32 32 weeks gestation of pregnancy: Secondary | ICD-10-CM

## 2018-09-02 DIAGNOSIS — O26893 Other specified pregnancy related conditions, third trimester: Secondary | ICD-10-CM

## 2018-09-02 NOTE — Progress Notes (Signed)
TELEHEALTH OBSTETRICS PRENATAL VIRTUAL VIDEO VISIT ENCOUNTER NOTE  Provider [facility]   I connected with Emily Andersonlaudia Wiley on 09/02/18 at 10:15 AM EDT by WebEx Video Encounter at home and verified that I am speaking with the correct person using two identifiers.   I discussed the limitations, risks, security and privacy concerns of performing an evaluation and management service virtually and the availability of in person appointments. I also discussed with the patient that there may be a patient responsible charge related to this service. The patient expressed understanding and agreed to proceed. Subjective:  Emily AndersonClaudia Wiley is a 33 y.o. G3P1011 at 7568w3d being seen today for ongoing prenatal care.  She is currently monitored for the following issues for this high-risk pregnancy and has Supervision of other normal pregnancy, antepartum; History of cesarean delivery; Hx of preeclampsia, prior pregnancy, currently pregnant, second trimester; and History of preterm premature rupture of membranes (PPROM) on their problem list.  Patient reports no complaints.  Contractions: Irritability. Vag. Bleeding: None.  Movement: Present. Denies any leaking of fluid.   The following portions of the patient's history were reviewed and updated as appropriate: allergies, current medications, past family history, past medical history, past social history, past surgical history and problem list.   Objective:   Vitals:   09/02/18 1031  BP: 112/81  Pulse: 67    Fetal Status:     Movement: Present     General:  Alert, oriented and cooperative. Patient is in no acute distress.  Respiratory: Normal respiratory effort, no problems with respiration noted  Mental Status: Normal mood and affect. Normal behavior. Normal judgment and thought content.  Rest of physical exam deferred due to type of encounter  Imaging: [place] Mfm Ob Follow Up  Result Date:  08/27/2018 ----------------------------------------------------------------------  OBSTETRICS REPORT                       (Signed Final 08/27/2018 03:36 pm) ---------------------------------------------------------------------- Patient Info  ID #:       [id number]                          D.O.B.:  05-Nov-1985 (32 yrs)  Name:       Emily PayorLAUDIA AGUILAR-                Visit Date: 08/27/2018 02:16 pm              GOMEZ ---------------------------------------------------------------------- Performed By  Performed By:     Sandi MealyJovancia Adrien        Ref. Address:     [address]                    [address]                                                             [address]                                                             [place], [place]  [place]  Attending:        Tama High MD        [facility]  Referred By:      Woodroe Mode                    MD ---------------------------------------------------------------------- Orders   #  Description                          Code         Ordered By   1  [place] MFM OB FOLLOW UP                  23557.32     Sander Nephew  ----------------------------------------------------------------------   #  Order #                    Accession #                 Episode #   1  [id number]                  [id number]                  [id number]  ---------------------------------------------------------------------- Indications   Maternal care for known or suspected poor      O36.5920   fetal growth, second trimester, not applicable   or unspecified   Previous cesarean delivery, antepartum         O34.219   Poor obstetrical history (PPROM, Term          O09.299   delivery @ 38 weeks)   Poor obstetric history: Previous               O09.299   preeclampsia / eclampsia/gestational HTN   Poor  obstetric history: Previous fetal growth  O09.299   restriction (FGR) per patient in [place]   [redacted] weeks gestation of pregnancy                Z3A.33  ---------------------------------------------------------------------- Vital Signs  Weight (lb): 153                               Height:        5'0"  BMI:         29.88 ---------------------------------------------------------------------- Fetal Evaluation  Num Of Fetuses:         1  Fetal Heart Rate(bpm):  145  Cardiac Activity:       Observed  Presentation:           Cephalic  Placenta:  Anterior  P. Cord Insertion:      Visualized  Amniotic Fluid  AFI FV:      Within normal limits  AFI Sum(cm)     %Tile       Largest Pocket(cm)  9.58            15          4.35  RUQ(cm)       RLQ(cm)       LUQ(cm)        LLQ(cm)  4.35          1.4           1.73           2.1 ---------------------------------------------------------------------- Biometry  BPD:      80.7  mm     G. Age:  32w 3d         20  %    CI:        73.41   %    70 - 86                                                          FL/HC:      21.3   %    19.9 - 21.5  HC:      299.3  mm     G. Age:  33w 1d         13  %    HC/AC:      1.07        0.96 - 1.11  AC:      280.3  mm     G. Age:  32w 1d         18  %    FL/BPD:     79.2   %    71 - 87  FL:       63.9  mm     G. Age:  33w 0d         31  %    FL/AC:      22.8   %    20 - 24  HUM:      53.9  mm     G. Age:  31w 2d         20  %  LV:          3  mm  Est. FW:    1987  gm      4 lb 6 oz     20  % ---------------------------------------------------------------------- OB History  Gravidity:    3         Term:   1        Prem:   0        SAB:   0  TOP:          0       Ectopic:  1        Living: 1 ---------------------------------------------------------------------- Gestational Age  LMP:           33w 2d        Date:  01/06/18                 EDD:   10/13/18  U/S Today:     32w 5d  EDD:   10/17/18  Best:           33w 2d     Det. By:  LMP  (01/06/18)          EDD:   10/13/18 ---------------------------------------------------------------------- Anatomy  Cranium:               Appears normal         Aortic Arch:            Previously seen  Cavum:                 Appears normal         Ductal Arch:            Previously seen  Ventricles:            Appears normal         Diaphragm:              Appears normal  Choroid Plexus:        Previously seen        Stomach:                Appears normal, left                                                                        sided  Cerebellum:            Previously seen        Abdomen:                Appears normal  Posterior Fossa:       Previously seen        Abdominal Wall:         Previously seen  Nuchal Fold:           Not applicable (>20    Cord Vessels:           Previously seen                         wks GA)  Face:                  Orbits nl; profile not Kidneys:                Appear normal                         well visualized  Lips:                  Appears normal         Bladder:                Appears normal  Thoracic:              Appears normal         Spine:                  Limited views  appear normal  Heart:                 Appears normal         Upper Extremities:      Previously seen                         (4CH, axis, and                         situs)  RVOT:                  Previously seen        Lower Extremities:      Previously seen  LVOT:                  Previously seen  Other:  Female gender Heels and 5th digit previously visualized. Technically          difficult due to fetal position. ---------------------------------------------------------------------- Impression  History of fetal growth restriction.  Fetal growth is appropriate for gestational age. Amniotic fluid  is normal and good fetal activity is seen. ----------------------------------------------------------------------  Recommendations  -An appointment was made for her to return in 4 weeks for  fetal growth assessment. ----------------------------------------------------------------------                  Noralee Spaceavi Shankar, MD Electronically Signed Final Report   08/27/2018 03:36 pm ----------------------------------------------------------------------   Assessment and Plan:  Pregnancy: G3P1011 at 667w3d 1. Abnormal glucose tolerance test (GTT)  - Glucose; Future  Preterm labor symptoms and general obstetric precautions including but not limited to vaginal bleeding, contractions, leaking of fluid and fetal movement were reviewed in detail with the patient. I discussed the assessment and treatment plan with the patient. The patient was provided an opportunity to ask questions and all were answered. The patient agreed with the plan and demonstrated an understanding of the instructions. The patient was advised to call back or seek an in-person office evaluation/[facility] for any urgent or concerning symptoms. Please refer to After Visit Summary for other counseling recommendations.   I provided 10 minutes of face-to-face time during this encounter.  Return for tomorrow for fasting lab.  Future Appointments  Date Time Provider Department Center  09/24/2018  1:45 PM [facility] NURSE [facility]-US  09/24/2018  1:45 PM [facility] US 2 [facility] MFC-US    Allie BossierMyra C Deontrae Drinkard, MD [facility]

## 2018-09-03 ENCOUNTER — Other Ambulatory Visit: Payer: Self-pay

## 2018-09-03 DIAGNOSIS — R7309 Other abnormal glucose: Secondary | ICD-10-CM

## 2018-09-04 LAB — GLUCOSE, RANDOM: Glucose: 82 mg/dL (ref 65–99)

## 2018-09-16 ENCOUNTER — Other Ambulatory Visit: Payer: Self-pay

## 2018-09-16 ENCOUNTER — Ambulatory Visit (INDEPENDENT_AMBULATORY_CARE_PROVIDER_SITE_OTHER): Payer: Self-pay | Admitting: Obstetrics and Gynecology

## 2018-09-16 ENCOUNTER — Encounter: Payer: Self-pay | Admitting: Obstetrics and Gynecology

## 2018-09-16 VITALS — BP 116/85 | HR 83

## 2018-09-16 DIAGNOSIS — Z8759 Personal history of other complications of pregnancy, childbirth and the puerperium: Secondary | ICD-10-CM

## 2018-09-16 DIAGNOSIS — Z3480 Encounter for supervision of other normal pregnancy, unspecified trimester: Secondary | ICD-10-CM

## 2018-09-16 DIAGNOSIS — O09293 Supervision of pregnancy with other poor reproductive or obstetric history, third trimester: Secondary | ICD-10-CM

## 2018-09-16 DIAGNOSIS — Z3A34 34 weeks gestation of pregnancy: Secondary | ICD-10-CM

## 2018-09-16 DIAGNOSIS — Z789 Other specified health status: Secondary | ICD-10-CM

## 2018-09-16 DIAGNOSIS — O09292 Supervision of pregnancy with other poor reproductive or obstetric history, second trimester: Secondary | ICD-10-CM

## 2018-09-16 DIAGNOSIS — Z98891 History of uterine scar from previous surgery: Secondary | ICD-10-CM

## 2018-09-16 NOTE — Progress Notes (Signed)
[place] interpreter [phone]

## 2018-09-16 NOTE — Progress Notes (Signed)
   PRENATAL VISIT NOTE  Subjective:  Emily Wiley is a 33 y.o. G3P1011 at [redacted]w[redacted]d being seen today for ongoing prenatal care.  She is currently monitored for the following issues for this high-risk pregnancy and has Supervision of other normal pregnancy, antepartum; History of cesarean delivery; Hx of preeclampsia, prior pregnancy, currently pregnant, second trimester; History of preterm premature rupture of membranes (PPROM); and Language barrier on their problem list.  Patient reports no complaints.  Contractions: Not present. Vag. Bleeding: None.  Movement: Present. Denies leaking of fluid.   The following portions of the patient's history were reviewed and updated as appropriate: allergies, current medications, past family history, past medical history, past social history, past surgical history and problem list.   Objective:   Vitals:   09/16/18 1326  BP: 116/85  Pulse: 83   Fetal Status:     Movement: Present     General:  Alert, oriented and cooperative. Patient is in no acute distress.  Skin: Skin is warm and dry. No rash noted.   Cardiovascular: Normal heart rate noted  Respiratory: Normal respiratory effort, no problems with respiration noted  Abdomen: Soft, gravid, appropriate for gestational age.  Pain/Pressure: Present     Pelvic: Cervical exam deferred        Extremities: Normal range of motion.  Edema: Trace  Mental Status: Normal mood and affect. Normal behavior. Normal judgment and thought content.   Assessment and Plan:  Pregnancy: G3P1011 at [redacted]w[redacted]d  1. Supervision of other normal pregnancy, antepartum No issues  2. History of cesarean delivery For TOLAC  3. Hx of preeclampsia, prior pregnancy, currently pregnant, second trimester Cont baby ASA Has f/u US scheduled for 09/24/18  4. History of preterm premature rupture of membranes (PPROM)  5. Language barrier Patent attorney used  Preterm labor symptoms and general obstetric precautions  including but not limited to vaginal bleeding, contractions, leaking of fluid and fetal movement were reviewed in detail with the patient. Please refer to After Visit Summary for other counseling recommendations.   Return in about 2 weeks (around 09/30/2018) for OB visit (MD), in person.  Future Appointments  Date Time Provider [place]  09/24/2018  1:45 PM [place] NURSE [address] M[facility]-US  09/24/2018  1:45 PM [place] 2 [facility]-M[facility] MFC-US    Sloan Leiter, MD

## 2018-09-24 ENCOUNTER — Ambulatory Visit (HOSPITAL_COMMUNITY): Payer: Self-pay | Admitting: *Deleted

## 2018-09-24 ENCOUNTER — Ambulatory Visit (HOSPITAL_COMMUNITY)
Admission: RE | Admit: 2018-09-24 | Discharge: 2018-09-24 | Disposition: A | Discharge status: Home / Self Care | Payer: Self-pay | Source: Ambulatory Visit | Attending: Obstetrics and Gynecology | Admitting: Obstetrics and Gynecology

## 2018-09-24 ENCOUNTER — Other Ambulatory Visit: Payer: Self-pay

## 2018-09-24 ENCOUNTER — Encounter (HOSPITAL_COMMUNITY): Payer: Self-pay | Admitting: *Deleted

## 2018-09-24 DIAGNOSIS — Z3A37 37 weeks gestation of pregnancy: Secondary | ICD-10-CM

## 2018-09-24 DIAGNOSIS — Z362 Encounter for other antenatal screening follow-up: Secondary | ICD-10-CM

## 2018-09-24 DIAGNOSIS — O365930 Maternal care for other known or suspected poor fetal growth, third trimester, not applicable or unspecified: Secondary | ICD-10-CM

## 2018-09-24 DIAGNOSIS — Z3480 Encounter for supervision of other normal pregnancy, unspecified trimester: Secondary | ICD-10-CM | POA: Insufficient documentation

## 2018-09-24 DIAGNOSIS — Z98891 History of uterine scar from previous surgery: Secondary | ICD-10-CM | POA: Insufficient documentation

## 2018-09-24 DIAGNOSIS — O09293 Supervision of pregnancy with other poor reproductive or obstetric history, third trimester: Secondary | ICD-10-CM | POA: Insufficient documentation

## 2018-09-24 DIAGNOSIS — O34219 Maternal care for unspecified type scar from previous cesarean delivery: Secondary | ICD-10-CM

## 2018-09-30 ENCOUNTER — Telehealth: Payer: Self-pay | Admitting: Obstetrics and Gynecology

## 2018-09-30 ENCOUNTER — Other Ambulatory Visit: Payer: Self-pay

## 2018-09-30 ENCOUNTER — Ambulatory Visit (INDEPENDENT_AMBULATORY_CARE_PROVIDER_SITE_OTHER): Payer: Self-pay | Admitting: Obstetrics and Gynecology

## 2018-09-30 VITALS — BP 124/85 | HR 83 | Temp 98.4°F | Wt 163.2 lb

## 2018-09-30 DIAGNOSIS — Z113 Encounter for screening for infections with a predominantly sexual mode of transmission: Secondary | ICD-10-CM

## 2018-09-30 DIAGNOSIS — O34211 Maternal care for low transverse scar from previous cesarean delivery: Secondary | ICD-10-CM

## 2018-09-30 DIAGNOSIS — Z3A36 36 weeks gestation of pregnancy: Secondary | ICD-10-CM

## 2018-09-30 DIAGNOSIS — O09292 Supervision of pregnancy with other poor reproductive or obstetric history, second trimester: Secondary | ICD-10-CM

## 2018-09-30 DIAGNOSIS — Z87898 Personal history of other specified conditions: Secondary | ICD-10-CM

## 2018-09-30 DIAGNOSIS — Z789 Other specified health status: Secondary | ICD-10-CM

## 2018-09-30 DIAGNOSIS — Z3480 Encounter for supervision of other normal pregnancy, unspecified trimester: Secondary | ICD-10-CM

## 2018-09-30 DIAGNOSIS — Z98891 History of uterine scar from previous surgery: Secondary | ICD-10-CM

## 2018-09-30 DIAGNOSIS — O0990 Supervision of high risk pregnancy, unspecified, unspecified trimester: Secondary | ICD-10-CM

## 2018-09-30 DIAGNOSIS — O0993 Supervision of high risk pregnancy, unspecified, third trimester: Secondary | ICD-10-CM

## 2018-09-30 DIAGNOSIS — O09293 Supervision of pregnancy with other poor reproductive or obstetric history, third trimester: Secondary | ICD-10-CM

## 2018-09-30 NOTE — Telephone Encounter (Signed)
Placed the patient in the 4:15 slot as she is non-english speaking. The provider had an IUD and non-english speaking patient in the before and after the 2:15 slot. The provider requested the patient return in 7-10 days however these are the only available options for the week of 9/1.

## 2018-09-30 NOTE — Progress Notes (Signed)
Prenatal Visit Note Date: 09/30/2018 [facility]: [facility]  Subjective:  Emily Wiley is a 33 y.o. G3P1011 at [redacted]w[redacted]d being seen today for ongoing prenatal care.  She is currently monitored for the following issues for this high-risk pregnancy and has Supervision of high risk pregnancy, antepartum; History of cesarean delivery; Hx of preeclampsia, prior pregnancy, currently pregnant, second trimester; History of preterm premature rupture of membranes (PPROM); and Language barrier on their problem list.  Patient reports left CTS s/s.   Contractions: Irritability. Vag. Bleeding: None.  Movement: Present. Denies leaking of fluid.   The following portions of the patient's history were reviewed and updated as appropriate: allergies, current medications, past family history, past medical history, past social history, past surgical history and problem list. Problem list updated.  Objective:   Vitals:   09/30/18 0919  BP: 124/85  Pulse: 83  Temp: 98.4 F (36.9 C)  Weight: 163 lb 3.2 oz (74 kg)    Fetal Status: Fetal Heart Rate (bpm): 155 Fundal Height: 35 cm Movement: Present  Presentation: Vertex  General:  Alert, oriented and cooperative. Patient is in no acute distress.  Skin: Skin is warm and dry. No rash noted.   Cardiovascular: Normal heart rate noted  Respiratory: Normal respiratory effort, no problems with respiration noted  Abdomen: Soft, gravid, appropriate for gestational age. Pain/Pressure: Present     Pelvic:  Cervical exam performed Dilation: 1 Effacement (%): 50 Station: -2  Extremities: Normal range of motion.  Edema: Trace  Mental Status: Normal mood and affect. Normal behavior. Normal judgment and thought content.   Urinalysis:      Assessment and Plan:  Pregnancy: G3P1011 at [redacted]w[redacted]d  1. Supervision of other normal pregnancy, antepartum Routine care. D/w her more re: BC nv - GC/Chlamydia probe amp ([place])not at [facility] - Culture, beta  strep (group b only)  2. History of cesarean delivery Desires tolac. Papers already signed  3. Supervision of high risk pregnancy, antepartum No issues  4. Language barrier Interpreter used  5. Hx of preeclampsia, prior pregnancy, currently pregnant, second trimester Continue low dose asa  6. History of poor fetal growth Normal growth last week. Rpt growth u/s prn. Follow [facility]Hs  Preterm labor symptoms and general obstetric precautions including but not limited to vaginal bleeding, contractions, leaking of fluid and fetal movement were reviewed in detail with the patient. Please refer to After Visit Summary for other counseling recommendations.  RTC 7-10. Prefers in person   [place], Eduard Clos, MD

## 2018-09-30 NOTE — Progress Notes (Signed)
Pt states is having some swelling & soreness in hands and wrist.

## 2018-10-01 LAB — GC/CHLAMYDIA PROBE AMP (~~LOC~~) NOT AT ARMC
Chlamydia: NEGATIVE
Neisseria Gonorrhea: NEGATIVE

## 2018-10-04 LAB — CULTURE, BETA STREP (GROUP B ONLY): Strep Gp B Culture: NEGATIVE

## 2018-10-07 ENCOUNTER — Telehealth: Payer: Self-pay | Admitting: Family Medicine

## 2018-10-07 NOTE — Telephone Encounter (Signed)
Called the patient to confirm the upcoming appointment. Left a detailed voicemail informing the patient if she’s been diagnosed with covid or in close contact with someone who’s had covid please call to reschedule the appointment. Also advised if she has experienced any flu like symptoms such as sore throat, shortness of breath, fever, or rash please also call to rescheduled the appointment. °

## 2018-10-08 ENCOUNTER — Other Ambulatory Visit: Payer: Self-pay

## 2018-10-08 ENCOUNTER — Inpatient Hospital Stay (HOSPITAL_COMMUNITY): Admission: AD | Admit: 2018-10-08 | Payer: Self-pay | Source: Home / Self Care | Admitting: Family Medicine

## 2018-10-08 ENCOUNTER — Inpatient Hospital Stay (HOSPITAL_COMMUNITY)
Admission: AD | Admit: 2018-10-08 | Discharge: 2018-10-11 | DRG: 807 | Disposition: A | Discharge status: Home / Self Care | Payer: Medicaid Other | Attending: Family Medicine | Admitting: Family Medicine

## 2018-10-08 ENCOUNTER — Encounter: Payer: Self-pay | Admitting: Medical

## 2018-10-08 ENCOUNTER — Encounter (HOSPITAL_COMMUNITY): Payer: Self-pay | Admitting: *Deleted

## 2018-10-08 ENCOUNTER — Ambulatory Visit (INDEPENDENT_AMBULATORY_CARE_PROVIDER_SITE_OTHER): Payer: Self-pay | Admitting: Medical

## 2018-10-08 VITALS — BP 144/96 | HR 69 | Temp 98.3°F | Wt 167.5 lb

## 2018-10-08 DIAGNOSIS — O09292 Supervision of pregnancy with other poor reproductive or obstetric history, second trimester: Secondary | ICD-10-CM

## 2018-10-08 DIAGNOSIS — O34211 Maternal care for low transverse scar from previous cesarean delivery: Secondary | ICD-10-CM | POA: Diagnosis present

## 2018-10-08 DIAGNOSIS — O1413 Severe pre-eclampsia, third trimester: Secondary | ICD-10-CM

## 2018-10-08 DIAGNOSIS — Z98891 History of uterine scar from previous surgery: Secondary | ICD-10-CM

## 2018-10-08 DIAGNOSIS — O1414 Severe pre-eclampsia complicating childbirth: Secondary | ICD-10-CM | POA: Diagnosis present

## 2018-10-08 DIAGNOSIS — O0993 Supervision of high risk pregnancy, unspecified, third trimester: Secondary | ICD-10-CM

## 2018-10-08 DIAGNOSIS — Z789 Other specified health status: Secondary | ICD-10-CM

## 2018-10-08 DIAGNOSIS — Z3A37 37 weeks gestation of pregnancy: Secondary | ICD-10-CM | POA: Diagnosis not present

## 2018-10-08 DIAGNOSIS — Z20828 Contact with and (suspected) exposure to other viral communicable diseases: Secondary | ICD-10-CM | POA: Diagnosis present

## 2018-10-08 DIAGNOSIS — Z3490 Encounter for supervision of normal pregnancy, unspecified, unspecified trimester: Secondary | ICD-10-CM | POA: Diagnosis present

## 2018-10-08 DIAGNOSIS — Z8759 Personal history of other complications of pregnancy, childbirth and the puerperium: Secondary | ICD-10-CM

## 2018-10-08 DIAGNOSIS — O368130 Decreased fetal movements, third trimester, not applicable or unspecified: Secondary | ICD-10-CM

## 2018-10-08 DIAGNOSIS — O0990 Supervision of high risk pregnancy, unspecified, unspecified trimester: Secondary | ICD-10-CM

## 2018-10-08 DIAGNOSIS — O09293 Supervision of pregnancy with other poor reproductive or obstetric history, third trimester: Secondary | ICD-10-CM

## 2018-10-08 LAB — COMPREHENSIVE METABOLIC PANEL
ALT: 19 U/L (ref 0–44)
AST: 27 U/L (ref 15–41)
Albumin: 2.6 g/dL — ABNORMAL LOW (ref 3.5–5.0)
Alkaline Phosphatase: 230 U/L — ABNORMAL HIGH (ref 38–126)
Anion gap: 10 (ref 5–15)
BUN: 8 mg/dL (ref 6–20)
CO2: 19 mmol/L — ABNORMAL LOW (ref 22–32)
Calcium: 8.9 mg/dL (ref 8.9–10.3)
Chloride: 107 mmol/L (ref 98–111)
Creatinine, Ser: 0.49 mg/dL (ref 0.44–1.00)
GFR calc Af Amer: >60 mL/min (ref >60)
GFR calc non Af Amer: >60 mL/min (ref >60)
Glucose, Bld: 114 mg/dL — ABNORMAL HIGH (ref 70–99)
Potassium: 4.5 mmol/L (ref 3.5–5.1)
Sodium: 136 mmol/L (ref 135–145)
Total Bilirubin: 0.4 mg/dL (ref 0.3–1.2)
Total Protein: 6.6 g/dL (ref 6.5–8.1)

## 2018-10-08 LAB — URINALYSIS, ROUTINE W REFLEX MICROSCOPIC
Bilirubin Urine: NEGATIVE
Glucose, UA: NEGATIVE mg/dL
Hgb urine dipstick: NEGATIVE
Ketones, ur: NEGATIVE mg/dL
Leukocytes,Ua: NEGATIVE
Nitrite: NEGATIVE
Protein, ur: 30 mg/dL — AB
Specific Gravity, Urine: 1.018 (ref 1.005–1.030)
pH: 6 (ref 5.0–8.0)

## 2018-10-08 LAB — ABO/RH: ABO/RH(D): O POS

## 2018-10-08 LAB — SARS CORONAVIRUS 2 BY RT PCR (HOSPITAL ORDER, PERFORMED IN ~~LOC~~ HOSPITAL LAB): SARS Coronavirus 2: NEGATIVE

## 2018-10-08 LAB — TYPE AND SCREEN
ABO/RH(D): O POS
Antibody Screen: NEGATIVE

## 2018-10-08 LAB — CBC
HCT: 38.8 % (ref 36.0–46.0)
Hemoglobin: 13 g/dL (ref 12.0–15.0)
MCH: 29.8 pg (ref 26.0–34.0)
MCHC: 33.5 g/dL (ref 30.0–36.0)
MCV: 89 fL (ref 80.0–100.0)
Platelets: 261 10*3/uL (ref 150–400)
RBC: 4.36 MIL/uL (ref 3.87–5.11)
RDW: 14.1 % (ref 11.5–15.5)
WBC: 8.3 10*3/uL (ref 4.0–10.5)
nRBC: 0 % (ref 0.0–0.2)

## 2018-10-08 LAB — PROTEIN / CREATININE RATIO, URINE
Creatinine, Urine: 91.24 mg/dL
Protein Creatinine Ratio: 0.48 mg/mg{Cre} — ABNORMAL HIGH (ref 0.00–0.15)
Total Protein, Urine: 44 mg/dL

## 2018-10-08 MED ORDER — SOD CITRATE-CITRIC ACID 500-334 MG/5ML PO SOLN: 30.0000 mL | ORAL | Status: DC | PRN

## 2018-10-08 MED ORDER — MAGNESIUM SULFATE 40 G IN LACTATED RINGERS - SIMPLE
2.0000 g/h | INTRAVENOUS | Status: DC
  Administered 2018-10-08: 2 g/h via INTRAVENOUS
  Filled 2018-10-08 (×3): qty 500

## 2018-10-08 MED ORDER — OXYCODONE-ACETAMINOPHEN 5-325 MG PO TABS: 2.0000 | ORAL_TABLET | ORAL | Status: DC | PRN

## 2018-10-08 MED ORDER — OXYTOCIN 40 UNITS IN NORMAL SALINE INFUSION - SIMPLE MED: 2.5000 [IU]/h | INTRAVENOUS | Status: DC

## 2018-10-08 MED ORDER — LACTATED RINGERS IV BOLUS
500.0000 mL | Freq: Once | INTRAVENOUS | Status: AC
  Administered 2018-10-08: 500 mL via INTRAVENOUS

## 2018-10-08 MED ORDER — LACTATED RINGERS IV SOLN
INTRAVENOUS | Status: DC
  Administered 2018-10-08 – 2018-10-09 (×3): via INTRAVENOUS

## 2018-10-08 MED ORDER — LABETALOL HCL 5 MG/ML IV SOLN
40.0000 mg | INTRAVENOUS | Status: DC | PRN
  Administered 2018-10-08: 40 mg via INTRAVENOUS
  Filled 2018-10-08: qty 8

## 2018-10-08 MED ORDER — MAGNESIUM SULFATE BOLUS VIA INFUSION
4.0000 g | Freq: Once | INTRAVENOUS | Status: AC
  Administered 2018-10-08: 4 g via INTRAVENOUS
  Filled 2018-10-08: qty 500

## 2018-10-08 MED ORDER — LABETALOL HCL 5 MG/ML IV SOLN: 40.0000 mg | INTRAVENOUS | Status: DC | PRN

## 2018-10-08 MED ORDER — OXYCODONE-ACETAMINOPHEN 5-325 MG PO TABS: 1.0000 | ORAL_TABLET | ORAL | Status: DC | PRN

## 2018-10-08 MED ORDER — HYDRALAZINE HCL 20 MG/ML IJ SOLN: 10.0000 mg | INTRAMUSCULAR | Status: DC | PRN

## 2018-10-08 MED ORDER — ONDANSETRON HCL 4 MG/2ML IJ SOLN
4.0000 mg | Freq: Four times a day (QID) | INTRAMUSCULAR | Status: DC | PRN
  Administered 2018-10-09: 4 mg via INTRAVENOUS
  Filled 2018-10-08: qty 2

## 2018-10-08 MED ORDER — TERBUTALINE SULFATE 1 MG/ML IJ SOLN: 0.2500 mg | Freq: Once | INTRAMUSCULAR | Status: DC | PRN

## 2018-10-08 MED ORDER — ACETAMINOPHEN 325 MG PO TABS: 650.0000 mg | ORAL_TABLET | ORAL | Status: DC | PRN

## 2018-10-08 MED ORDER — LIDOCAINE HCL (PF) 1 % IJ SOLN
30.0000 mL | INTRAMUSCULAR | Status: AC | PRN
  Administered 2018-10-09: 30 mL via SUBCUTANEOUS
  Filled 2018-10-08 (×2): qty 30

## 2018-10-08 MED ORDER — LACTATED RINGERS IV SOLN
500.0000 mL | INTRAVENOUS | Status: DC | PRN
  Administered 2018-10-09: 1000 mL via INTRAVENOUS

## 2018-10-08 MED ORDER — OXYTOCIN BOLUS FROM INFUSION
500.0000 mL | Freq: Once | INTRAVENOUS | Status: AC
  Administered 2018-10-09: 500 mL via INTRAVENOUS

## 2018-10-08 MED ORDER — LABETALOL HCL 5 MG/ML IV SOLN
80.0000 mg | INTRAVENOUS | Status: DC | PRN
  Administered 2018-10-08: 80 mg via INTRAVENOUS
  Filled 2018-10-08: qty 16

## 2018-10-08 MED ORDER — LACTATED RINGERS IV SOLN
INTRAVENOUS | Status: DC
  Administered 2018-10-08: 19:00:00 via INTRAVENOUS

## 2018-10-08 MED ORDER — OXYTOCIN 40 UNITS IN NORMAL SALINE INFUSION - SIMPLE MED
1.0000 m[IU]/min | INTRAVENOUS | Status: DC
  Administered 2018-10-09: 2 m[IU]/min via INTRAVENOUS
  Filled 2018-10-08: qty 1000

## 2018-10-08 MED ORDER — LABETALOL HCL 5 MG/ML IV SOLN
20.0000 mg | INTRAVENOUS | Status: DC | PRN
  Administered 2018-10-08: 20 mg via INTRAVENOUS
  Filled 2018-10-08: qty 4

## 2018-10-08 MED ORDER — LABETALOL HCL 5 MG/ML IV SOLN: 80.0000 mg | INTRAVENOUS | Status: DC | PRN

## 2018-10-08 MED ORDER — LABETALOL HCL 5 MG/ML IV SOLN
20.0000 mg | INTRAVENOUS | Status: DC | PRN
  Administered 2018-10-09: 20 mg via INTRAVENOUS
  Filled 2018-10-08: qty 4

## 2018-10-08 NOTE — MAU Provider Note (Signed)
Patient Emily AndersonClaudia Aguilar Wiley is a 33 y.o. G3P1011 At 8127w4d here after being [facility]. She endorses slight contractions, she denies vision changes (had "twinikly" vision on Sunday but none today). She denies blurry visions, floating spots, abdominal pain, VB  She states that she has felt decreased fetal movements for the past two days. She endorses some white vaginal discharge, but no loss of fluid.   History     CSN: [id number]  Arrival date and time: 10/08/18 1733   First Provider Initiated Contact with Patient 10/08/18 1816      Chief Complaint  Patient presents with  . Decreased Fetal Movement  . Hypertension   Hypertension This is a new problem. The current episode started today. Pertinent negatives include no blurred vision or shortness of breath.  Patient says that her BP was elevated on Sunday (130/87), which was normal for her.   OB History    Gravida  3   Para  1   Term  1   Preterm  0   AB  1   Living  1     SAB      TAB      Ectopic  1   Multiple      Live Births  1           Past Medical History:  Diagnosis Date  . Pregnancy induced hypertension     Past Surgical History:  Procedure Laterality Date  . CESAREAN SECTION      Family History  Problem Relation Age of Onset  . Hypertension Mother     Social History   Tobacco Use  . Smoking status: Never Smoker  . Smokeless tobacco: Never Used  Substance Use Topics  . Alcohol use: Not Currently  . Drug use: Not Currently    Allergies: No Known Allergies  Medications Prior to Admission  Medication Sig Dispense Refill Last Dose  . acetaminophen (TYLENOL) 325 MG tablet Take 650 mg by mouth every 6 (six) hours as needed.   10/08/2018 at Unknown time  . aspirin 81 MG chewable tablet Chew 1 tablet (81 mg total) by mouth daily. 90 tablet 1 10/08/2018 at Unknown time  . Elastic Bandages & Supports (ABDOMINAL SUPPORT/L-XL) MISC 1 each by Does not apply route as  needed. 1 each 0 10/08/2018 at Unknown time  . Prenatal Vit-Fe Fumarate-FA (PRENATAL PO) Take by mouth.   10/08/2018 at Unknown time    Review of Systems  Constitutional: Negative.   HENT: Negative.   Eyes: Negative for blurred vision.  Respiratory: Negative.  Negative for shortness of breath.   Cardiovascular: Negative.   Gastrointestinal: Negative.   Genitourinary: Negative.   Neurological: Negative.    Physical Exam   Blood pressure (!) 168/93, pulse 66, temperature 98.6 F (37 C), resp. rate 16, last menstrual period 01/06/2018.  Physical Exam  Constitutional: She appears well-developed.  HENT:  Head: Normocephalic.  Neck: Normal range of motion.  Cardiovascular: Normal rate.  Respiratory: Effort normal.  GI: Soft.    MAU Course  Procedures  MDM -will draw Pre-e labs and admission labs; patient does not feel contractions.  -will start IV fluids -patient confirms that [place], the doctor told her that she can have a vaginal delivery. States that doctor said that both incisions (skin and uterine) were vertex.  -CBC and CMP are normal, now with proteinuria at 0.48 -required two doses of IV labetalol in MAU, recommend  admission as she has had 3 severe range pressures while in MAU plus severe range pressures at [facility] this afternoon.   Assessment and Plan  -due to severe range blood pressures, this patient now meets criteria for pre-eclampsia with severe features. She continues to be asymptomatic.  -case discussed with Labor team, patient to be admitted and induced.  -start with low dose pitocin and then FB when appropriate.  -begin MgSo4  Patient Vitals for the past 24 hrs:  BP Temp Pulse Resp SpO2  10/08/18 1915 (!) 152/116 - 74 - 100 %  10/08/18 1900 (!) 141/110 - 68 - 100 %  10/08/18 1845 (!) 158/85 - 64 - -  10/08/18 1830 (!) 150/77 - 66 - -  10/08/18 1815 116/67 - 65 - -  10/08/18 1759 (!) 168/93 98.6 F (37 C) 66 16 -     Mervyn Skeeters K[place] 10/08/2018, 6:26 PM

## 2018-10-08 NOTE — Progress Notes (Signed)
Pt states baby is moving daily but not as much as he was. Pt feels due to the pressure that she feeling that she needs to have a bowel movement all the time.

## 2018-10-08 NOTE — Progress Notes (Signed)
   PRENATAL VISIT NOTE  Subjective:  Emily Wiley is a 33 y.o. G3P1011 at [redacted]w[redacted]d being seen today for ongoing prenatal care.  She is currently monitored for the following issues for this high-risk pregnancy and has Supervision of high risk pregnancy, antepartum; History of cesarean delivery; Hx of preeclampsia, prior pregnancy, currently pregnant, second trimester; History of preterm premature rupture of membranes (PPROM); and Language barrier on their problem list.  Patient reports backache, fatigue and occasional contractions.  Contractions: Irregular. Vag. Bleeding: None.  Movement: (!) Decreased. Denies leaking of fluid.   The following portions of the patient's history were reviewed and updated as appropriate: allergies, current medications, past family history, past medical history, past social history, past surgical history and problem list.   Objective:   Vitals:   10/08/18 1636 10/08/18 1652  BP: (!) 141/82 (!) 144/96  Pulse: 69   Temp: 98.3 F (36.8 C)   Weight: 167 lb 8 oz (76 kg)     Fetal Status: Fetal Heart Rate (bpm): 147   Movement: (!) Decreased     General:  Alert, oriented and cooperative. Patient is in no acute distress.  Skin: Skin is warm and dry. No rash noted.   Cardiovascular: Normal heart rate noted  Respiratory: Normal respiratory effort, no problems with respiration noted  Abdomen: Soft, gravid, appropriate for gestational age.  Pain/Pressure: Present     Pelvic: Cervical exam performed      1/80/-2  Extremities: Normal range of motion.  Edema: Trace  Mental Status: Normal mood and affect. Normal behavior. Normal judgment and thought content.   Assessment and Plan:  Pregnancy: G3P1011 at [redacted]w[redacted]d 1. Supervision of high risk pregnancy, antepartum - decreased FM x 2 days   2. History of cesarean delivery - Plans TOLAC, consent signed   3. History of preterm premature rupture of membranes (PPROM)  4. Hx of preeclampsia, prior pregnancy,  currently pregnant, second trimester - Elevated BP today for first time in this pregnancy - Patient denies headache. She does have occasional scotoma, mild LE edema - Patient sent to MAU for further evaluation   5. Language barrier Medical sales representative, interpreter present for visit   Term labor symptoms and general obstetric precautions including but not limited to vaginal bleeding, contractions, leaking of fluid and fetal movement were reviewed in detail with the patient. Please refer to After Visit Summary for other counseling recommendations.   Return in about 1 week (around 10/15/2018) for LOB, In-Person.  Future Appointments  Date Time Provider [place]  10/24/2018  2:15 PM Lavonia Drafts, MD [facility] [place]  10/28/2018  2:15 PM [facility]  10/28/2018  3:15 PM [place] [address], [place]    Kerry Hough, PA-C

## 2018-10-08 NOTE — H&P (Signed)
LABOR AND DELIVERY ADMISSION HISTORY AND PHYSICAL NOTE  Emily AndersonClaudia Aguilar Wiley is a 33 y.o. female G3P1011 with IUP at 5039w4d by LMP c/w 23 week US presenting for IOL for Pre-eclampsia with severe features.   She reports positive fetal movement. She denies leakage of fluid or vaginal bleeding.  She plans on bottle and breast feeding. She requests Nexplanon for birth control.  Prenatal History/Complications: [facility]:  @[redacted]w[redacted]d , CWD, normal anatomy, cephalic presentation, EFW 2773g, 21%ile Pregnancy complications:  - PreE w SF diagnosed today 10/08/2018  Past Medical History: Past Medical History:  Diagnosis Date  . Pregnancy induced hypertension     Past Surgical History: Past Surgical History:  Procedure Laterality Date  . CESAREAN SECTION      Obstetrical History: OB History    Gravida  3   Para  1   Term  1   Preterm  0   AB  1   Living  1     SAB      TAB      Ectopic  1   Multiple      Live Births  1           Social History: Social History   Socioeconomic History  . Marital status: Married    Spouse name: Not on file  . Number of children: Not on file  . Years of education: Not on file  . Highest education level: Not on file  Occupational History  . Not on file  Social Needs  . Financial resource strain: Not on file  . Food insecurity    Worry: Sometimes true    Inability: Sometimes true  . Transportation needs    Medical: No    Non-medical: Yes  Tobacco Use  . Smoking status: Never Smoker  . Smokeless tobacco: Never Used  Substance and Sexual Activity  . Alcohol use: Not Currently  . Drug use: Not Currently  . Sexual activity: Yes    Birth control/protection: None  Lifestyle  . Physical activity    Days per week: Not on file    Minutes per session: Not on file  . Stress: Not on file  Relationships  . Social Musicianconnections    Talks on phone: Not on file    Gets together: Not on file    Attends religious service:  Not on file    Active member of club or organization: Not on file    Attends meetings of clubs or organizations: Not on file    Relationship status: Not on file  Other Topics Concern  . Not on file  Social History Narrative  . Not on file    Family History: Family History  Problem Relation Age of Onset  . Hypertension Mother     Allergies: No Known Allergies  Medications Prior to Admission  Medication Sig Dispense Refill Last Dose  . acetaminophen (TYLENOL) 325 MG tablet Take 650 mg by mouth every 6 (six) hours as needed.   10/08/2018 at Unknown time  . aspirin 81 MG chewable tablet Chew 1 tablet (81 mg total) by mouth daily. 90 tablet 1 10/08/2018 at Unknown time  . Elastic Bandages & Supports (ABDOMINAL SUPPORT/L-XL) MISC 1 each by Does not apply route as needed. 1 each 0 10/08/2018 at Unknown time  . Prenatal Vit-Fe Fumarate-FA (PRENATAL PO) Take by mouth.   10/08/2018 at Unknown time     Review of Systems  All systems reviewed and negative except as stated in HPI  Physical Exam Blood pressure (!) 163/97, pulse 70, temperature 98.6 F (37 C), resp. rate 16, last menstrual period 01/06/2018, SpO2 100 %. General appearance: alert, oriented, NAD Lungs: normal respiratory effort Heart: regular rate Abdomen: soft, non-tender; gravid, leopolds 2800g Extremities: No calf swelling or tenderness Presentation: cephalic by prior exam and leopolds Fetal monitoringBaseline: 145 bpm, Variability: Good {> 6 bpm), Accelerations: absent and Decelerations: Absent Uterine activityNone Dilation: Fingertip Station: Ballotable Exam by:: Claretha Cooper, CNM  Prenatal labs: ABO, Rh: --/--/O POS (09/01 1835) Antibody: Negative (02/10 0000) Rubella: Immune (02/10 0000) RPR: Non Reactive (06/29 1027)  HBsAg: Negative (02/10 0000)  HIV: Non Reactive (06/29 1027)  GC/Chlamydia: neg/neg 09/30/2018  GBS:   Negative 09/30/2018 2-hr GTT: fasting abnormal at 100, 1 and 2 hour normal, subsequent fasting  glucose normal at 82 Genetic screening:  Low risk Panorama Anatomy US: normal  Prenatal Transfer Tool  Maternal Diabetes: No Genetic Screening: Normal Maternal Ultrasounds/Referrals: Normal Fetal Ultrasounds or other Referrals:  None Maternal Substance Abuse:  No Significant Maternal Medications:  None Significant Maternal Lab Results: Group B Strep negative  Results for orders placed or performed during the hospital encounter of 10/08/18 (from the past 24 hour(s))  Urinalysis, Routine w reflex microscopic   Collection Time: 10/08/18  6:30 PM  Result Value Ref Range   Color, Urine YELLOW YELLOW   APPearance CLEAR CLEAR   Specific Gravity, Urine 1.018 1.005 - 1.030   pH 6.0 5.0 - 8.0   Glucose, UA NEGATIVE NEGATIVE mg/dL   Hgb urine dipstick NEGATIVE NEGATIVE   Bilirubin Urine NEGATIVE NEGATIVE   Ketones, ur NEGATIVE NEGATIVE mg/dL   Protein, ur 30 (A) NEGATIVE mg/dL   Nitrite NEGATIVE NEGATIVE   Leukocytes,Ua NEGATIVE NEGATIVE   RBC / HPF 0-5 0 - 5 RBC/hpf   WBC, UA 0-5 0 - 5 WBC/hpf   Bacteria, UA RARE (A) NONE SEEN   Squamous Epithelial / LPF 0-5 0 - 5   Mucus PRESENT   Protein / creatinine ratio, urine   Collection Time: 10/08/18  6:30 PM  Result Value Ref Range   Creatinine, Urine 91.24 mg/dL   Total Protein, Urine 44 mg/dL   Protein Creatinine Ratio 0.48 (H) 0.00 - 0.15 mg/mg[Cre]  ABO/Rh   Collection Time: 10/08/18  6:35 PM  Result Value Ref Range   ABO/RH(D) O POS    No rh immune globuloin      NOT A RH IMMUNE GLOBULIN CANDIDATE, PT RH POSITIVE Performed at [facility], [address] [place], [place] [address]   CBC   Collection Time: 10/08/18  6:45 PM  Result Value Ref Range   WBC 8.3 4.0 - 10.5 K/uL   RBC 4.36 3.87 - 5.11 MIL/uL   Hemoglobin 13.0 12.0 - 15.0 g/dL   HCT 38.8 36.0 - 46.0 %   MCV 89.0 80.0 - 100.0 fL   MCH 29.8 26.0 - 34.0 pg   MCHC 33.5 30.0 - 36.0 g/dL   RDW 14.1 11.5 - 15.5 %   Platelets 261 150 - 400 K/uL   nRBC 0.0 0.0 -  0.2 %  Comprehensive metabolic panel   Collection Time: 10/08/18  6:45 PM  Result Value Ref Range   Sodium 136 135 - 145 mmol/L   Potassium 4.5 3.5 - 5.1 mmol/L   Chloride 107 98 - 111 mmol/L   CO2 19 (L) 22 - 32 mmol/L   Glucose, Bld 114 (H) 70 - 99 mg/dL   BUN 8 6 - 20  mg/dL   Creatinine, Ser 0.13 0.44 - 1.00 mg/dL   Calcium 8.9 8.9 - 14.3 mg/dL   Total Protein 6.6 6.5 - 8.1 g/dL   Albumin 2.6 (L) 3.5 - 5.0 g/dL   AST 27 15 - 41 U/L   ALT 19 0 - 44 U/L   Alkaline Phosphatase 230 (H) 38 - 126 U/L   Total Bilirubin 0.4 0.3 - 1.2 mg/dL   GFR calc non Af Amer >60 >60 mL/min   GFR calc Af Amer >60 >60 mL/min   Anion gap 10 5 - 15    Patient Active Problem List   Diagnosis Date Noted  . Pre-eclampsia, severe, antepartum, third trimester 10/08/2018  . Language barrier 09/16/2018  . Supervision of high risk pregnancy, antepartum 04/08/2018  . History of cesarean delivery 04/08/2018  . Hx of preeclampsia, prior pregnancy, currently pregnant, second trimester 04/08/2018  . History of preterm premature rupture of membranes (PPROM) 04/08/2018    Assessment: Emily Wiley is a 33 y.o. G3P1011 at [redacted]w[redacted]d here for IOL for Pre-E with SF by BP's.   #Labor: TOLAC. Fingertip. Able to place foley with speculum and rings forceps through some resistance, however significant blood return (~40cc) after placement which resolved without intervention. [place] reviewed, anterior placenta, bleeding likely secondary to cervical dilation, unlikely to be due to abruption or other mechanical disruption of placenta given location. During and after placement FHT was Cat I, currently Cat II due to min variability but appears to be sleep pattern, will cont to monitor closely. Plan to also start pitocin once tracing improves.  #Pain: Planning epidural #FWB: Cat I currently, CEFM #GBS/ID:  negative #COVID: swab pending #MOF: bottle and breast #MOC: Nexplanon #Circ:  n/a  #Pre-eclampsia with severe features  by BP: Labs notable for P:C 0.48, CMP and CBC unremarkable. S/p IV labetalol 20>40>80 with BP's now improving. Start Mg gtt, consider PO meds pending progress.   #VBAC: Prior pregnancy c/b PreE, PPROM at 36 weeks and NRFHT leading to cesarean. Counseled outpatient on TOLAC, consent signed 08/05/2018.  #LSIL pap: LSIL on 03/18/2018, unremarkable colpo 03/25/2018 (no biopsy or ECC taken, needs repeat pap w co-testing 03/2019.   Mary Sella [facility] 10/08/2018, 8:11 PM

## 2018-10-08 NOTE — Patient Instructions (Signed)
Evaluacin de los movimientos fetales Fetal Movement Counts Introduccin Nombre del paciente: ________________________________________________ Emily Wiley estimada: ____________________ Emily Wiley evaluacin de los movimientos fetales?  Una evaluacin de los movimientos fetales es el registro del nmero de veces que siente que el beb se mueve durante un cierto perodo de [place]. Esto tambin se puede denominar recuento de patadas fetales. Una evaluacin de movimientos fetales se recomienda a todas las embarazadas. Es posible que le indiquen que comience a Development worker, community los movimientos fetales desde la semana 28 de [place]. Preste atencin cuando sienta que el beb est ms activo. Podr detectar los ciclos en que el beb duerme y est despierto. Tambin podr detectar que ciertas cosas hacen que su beb se mueva ms. Deber realizar una evaluacin de los movimientos fetales en las siguientes situaciones:  Cuando el beb est ms activo habitualmente.  A la [employer], todos los [place]. Un buen momento para evaluar los movimientos fetales es cuando est descansando, despus de haber comido y bebido algo. Cmo debo contar los movimientos fetales? 1. Encuentre un lugar tranquilo y cmodo. Sintese o acustese de lado. 2. Anote la fecha, la hora de inicio y de finalizacin y la cantidad de movimientos que sinti entre esas dos horas. Lleve esta informacin a las visitas de control. 3. Cuente las pataditas, revoloteos, chasquidos, vueltas o pinchazos en un perodo de 2horas. Debe sentir al menos en 2horas. 4. Cuando sienta , puede dejar de contar. 5. Si no siente en 2horas, coma y beba algo. Luego, contine descansando y Industrial/product designer. Si siente al menos durante esa hora, puede dejar de contar. Comunquese con un mdico si:  Siente menos de en 2horas.  El beb no se mueve tanto como suele hacerlo. Fecha:  ____________ Stevan Born inicio: ____________ Stevan Born finalizacin: ____________ Movimientos: ____________ Franco Nones: ____________ Stevan Born inicio: ____________ Stevan Born finalizacin: ____________ Movimientos: ____________ Franco Nones: ____________ Stevan Born inicio: ____________ Stevan Born finalizacin: ____________ Movimientos: ____________ Franco Nones: ____________ Stevan Born inicio: ____________ Stevan Born finalizacin: ____________ Movimientos: ____________ Franco Nones: ____________ Stevan Born inicio: ____________ Mammie Russian de finalizacin: ____________ Movimientos: ____________ Franco Nones: ____________ Stevan Born inicio: ____________ Mammie Russian de finalizacin: ____________ Movimientos: ____________ Franco Nones: ____________ Stevan Born inicio: ____________ Mammie Russian de finalizacin: ____________ Movimientos: ____________ Franco Nones: ____________ Stevan Born inicio: ____________ Stevan Born finalizacin: ____________ Movimientos: ____________ Franco Nones: ____________ Stevan Born inicio: ____________ Mammie Russian de finalizacin: ____________ Movimientos: ____________ Esta informacin no tiene como fin reemplazar el consejo del mdico. Asegrese de hacerle al mdico cualquier pregunta que tenga. Document Released: 05/02/2007 Document Revised: 04/28/2016 Document Reviewed: 03/04/2015 Elsevier Patient Education  2020 [employer]. [employer] de Braxton Hicks Braxton Hicks Contractions Las contracciones del tero pueden presentarse durante todo el [place], [place] no siempre indican que la mujer est de [place]. Es posible que usted haya tenido contracciones de prctica llamadas "contracciones de [place]". A veces, se las confunde con el parto real. Qu son las contracciones de [place]? Las contracciones de [place] son espasmos que se producen en los msculos del tero antes del [place]. A diferencia de las contracciones del parto verdadero, estas no producen el agrandamiento (la dilatacin) ni el afinamiento del cuello uterino. Hacia el final del embarazo [facility] las semanas [phone]),  las contracciones de Braxton Hicks pueden presentarse ms seguido y tornarse ms intensas. A veces, resulta difcil distinguirlas del parto verdadero porque pueden ser [employer]. No debe sentirse avergonzada si concurre al hospital con falso parto. En ocasiones, la nica forma de saber si el Aleen Campi  de parto es verdadero es que el mdico determine si hay cambios en el cuello del tero. El mdico le har un examen fsico y quizs le controle las contracciones. Si usted no est de parto verdadero, el examen debe indicar que el cuello uterino no est dilatado y que usted no ha roto bolsa. Si no hay otros problemas de salud asociados con su embarazo, no habr inconvenientes si la envan a su casa con un falso parto. Es posible que las contracciones de Braxton Hicks continen hasta que se desencadene el parto verdadero. Cmo diferenciar el trabajo de parto falso del verdadero Trabajo de parto verdadero  Las contracciones duran de 30a70segundos.  Las contracciones pueden tornarse muy regulares.  La molestia generalmente se siente en la parte superior del tero y se extiende hacia la zona baja del abdomen y hacia la cintura.  Las contracciones no desaparecen cuando usted camina.  Las contracciones generalmente se hacen ms intensas y aumentan en frecuencia.  El cuello uterino se dilata y se afina. Parto falso  En general, las contracciones son ms cortas y no tan intensas como las del parto verdadero.  En general, las contracciones son irregulares.  A menudo, las contracciones se sienten en la parte delantera de la parte baja del abdomen y en la ingle.  Las contracciones pueden desaparecer cuando usted camina o cambia de posicin mientras est acostada.  Las contracciones se vuelven ms dbiles y su duracin es menor a medida que transcurre el tiempo.  En general, el cuello uterino no se dilata ni se afina. Siga estas indicaciones en su casa:   Tome los medicamentos de venta libre y  los recetados solamente como se lo haya indicado el mdico.  Contine haciendo los ejercicios habituales y siga las dems indicaciones que el mdico le d.  Coma y beba con moderacin si cree que est de parto.  Si las contracciones de Braxton Hicks le provocan incomodidad: ? Cambie de posicin: si est acostada o descansando, camine; si est caminando, descanse. ? Sintese y descanse en una baera con agua tibia. ? Beba suficiente lquido como para mantener la orina de color amarillo plido. La deshidratacin puede provocar contracciones. ? Respire lenta y profundamente varias veces por hora.  Vaya a todas las visitas de control prenatales y de control como se lo haya indicado el mdico. Esto es importante. Comunquese con un mdico si:  Tiene fiebre.  Siente dolor constante en el abdomen. Solicite ayuda de inmediato si:  Las contracciones se intensifican, se hacen ms regulares y cercanas entre s.  Tiene una prdida de lquido por la vagina.  Elimina una mucosidad sanguinolenta (prdida del tapn mucoso).  Tiene una hemorragia vaginal.  Tiene un dolor en la zona lumbar que nunca tuvo antes.  Siente que la cabeza del beb empuja hacia abajo y ejerce presin en la zona plvica.  El beb no se mueve tanto como antes. Resumen  Las contracciones que se presentan antes del parto se conocen como contracciones de Braxton Hicks, falso parto o contracciones de prctica.  En general, las contracciones de Braxton Hicks son ms cortas, ms dbiles, con ms tiempo entre una y otra, y menos regulares que las contracciones del parto verdadero. Las contracciones del parto verdadero se intensifican progresivamente y se tornan regulares y ms frecuentes.  Para controlar la molestia que producen las contracciones de Braxton Hicks, puede cambiar de posicin, darse un bao templado y descansar, beber mucha agua o practicar la respiracin profunda. Esta informacin no tiene como   fin reemplazar el  consejo del mdico. Asegrese de hacerle al mdico cualquier pregunta que tenga. Document Released: 09/04/2016 Document Revised: 05/04/2017 Document Reviewed: 09/04/2016 Elsevier Patient Education  2020 [employer].

## 2018-10-08 NOTE — MAU Note (Signed)
.   Emily Wiley is a 33 y.o. at [redacted]w[redacted]d here in MAU.. was sent over from office for decrease in fetal movement and increase in BP. PT had a C/S with 1st pregnancy due to PRE E. Denies any pain   Onset of complaint: today    Pain score: 0   Vitals:   10/08/18 1759  BP: (!) 168/93  Pulse: 66  Resp: 16  Temp: 98.6 F (37 C)     FHT:148 Lab orders placed from triage: UA

## 2018-10-09 ENCOUNTER — Inpatient Hospital Stay (HOSPITAL_COMMUNITY): Payer: Medicaid Other | Admitting: Anesthesiology

## 2018-10-09 ENCOUNTER — Encounter (HOSPITAL_COMMUNITY): Payer: Self-pay

## 2018-10-09 LAB — CBC WITH DIFFERENTIAL/PLATELET
Abs Immature Granulocytes: 0.1 10*3/uL — ABNORMAL HIGH (ref 0.00–0.07)
Abs Immature Granulocytes: 0.08 10*3/uL — ABNORMAL HIGH (ref 0.00–0.07)
Basophils Absolute: 0 10*3/uL (ref 0.0–0.1)
Basophils Absolute: 0 10*3/uL (ref 0.0–0.1)
Basophils Relative: 0 %
Basophils Relative: 0 %
Eosinophils Absolute: 0 10*3/uL (ref 0.0–0.5)
Eosinophils Absolute: 0 10*3/uL (ref 0.0–0.5)
Eosinophils Relative: 0 %
Eosinophils Relative: 0 %
HCT: 37.6 % (ref 36.0–46.0)
HCT: 38.9 % (ref 36.0–46.0)
Hemoglobin: 12.7 g/dL (ref 12.0–15.0)
Hemoglobin: 12.9 g/dL (ref 12.0–15.0)
Immature Granulocytes: 1 %
Immature Granulocytes: 1 %
Lymphocytes Relative: 16 %
Lymphocytes Relative: 15 %
Lymphs Abs: 1.8 10*3/uL (ref 0.7–4.0)
Lymphs Abs: 1.8 10*3/uL (ref 0.7–4.0)
MCH: 29.7 pg (ref 26.0–34.0)
MCH: 29.3 pg (ref 26.0–34.0)
MCHC: 33.8 g/dL (ref 30.0–36.0)
MCHC: 33.2 g/dL (ref 30.0–36.0)
MCV: 87.9 fL (ref 80.0–100.0)
MCV: 88.2 fL (ref 80.0–100.0)
Monocytes Absolute: 0.5 10*3/uL (ref 0.1–1.0)
Monocytes Absolute: 0.5 10*3/uL (ref 0.1–1.0)
Monocytes Relative: 5 %
Monocytes Relative: 4 %
Neutro Abs: 8.9 10*3/uL — ABNORMAL HIGH (ref 1.7–7.7)
Neutro Abs: 9.7 10*3/uL — ABNORMAL HIGH (ref 1.7–7.7)
Neutrophils Relative %: 78 %
Neutrophils Relative %: 80 %
Platelets: 234 10*3/uL (ref 150–400)
Platelets: 256 10*3/uL (ref 150–400)
RBC: 4.28 MIL/uL (ref 3.87–5.11)
RBC: 4.41 MIL/uL (ref 3.87–5.11)
RDW: 14.1 % (ref 11.5–15.5)
RDW: 14.2 % (ref 11.5–15.5)
WBC: 11.3 10*3/uL — ABNORMAL HIGH (ref 4.0–10.5)
WBC: 12.2 10*3/uL — ABNORMAL HIGH (ref 4.0–10.5)
nRBC: 0 % (ref 0.0–0.2)
nRBC: 0 % (ref 0.0–0.2)

## 2018-10-09 LAB — RPR: RPR Ser Ql: NONREACTIVE

## 2018-10-09 MED ORDER — TRANEXAMIC ACID-NACL 1000-0.7 MG/100ML-% IV SOLN
INTRAVENOUS | Status: AC
  Administered 2018-10-09: 1000 mg
  Filled 2018-10-09: qty 100

## 2018-10-09 MED ORDER — FENTANYL CITRATE (PF) 100 MCG/2ML IJ SOLN
INTRAMUSCULAR | Status: AC
  Filled 2018-10-09: qty 2

## 2018-10-09 MED ORDER — MISOPROSTOL 200 MCG PO TABS
400.0000 ug | ORAL_TABLET | Freq: Once | ORAL | Status: AC
  Administered 2018-10-09: 23:00:00 400 ug via BUCCAL
  Filled 2018-10-09: qty 2

## 2018-10-09 MED ORDER — LACTATED RINGERS IV SOLN: 500.0000 mL | Freq: Once | INTRAVENOUS | Status: DC

## 2018-10-09 MED ORDER — FENTANYL CITRATE (PF) 100 MCG/2ML IJ SOLN
100.0000 ug | INTRAMUSCULAR | Status: DC | PRN
  Administered 2018-10-09 (×3): 100 ug via INTRAVENOUS
  Filled 2018-10-09 (×2): qty 2

## 2018-10-09 MED ORDER — PHENYLEPHRINE 40 MCG/ML (10ML) SYRINGE FOR IV PUSH (FOR BLOOD PRESSURE SUPPORT): 80.0000 ug | PREFILLED_SYRINGE | INTRAVENOUS | Status: DC | PRN

## 2018-10-09 MED ORDER — BUTORPHANOL TARTRATE 1 MG/ML IJ SOLN: 1.0000 mg | Freq: Once | INTRAMUSCULAR | Status: DC

## 2018-10-09 MED ORDER — FENTANYL-BUPIVACAINE-NACL 0.5-0.125-0.9 MG/250ML-% EP SOLN
12.0000 mL/h | EPIDURAL | Status: DC | PRN
  Filled 2018-10-09: qty 250

## 2018-10-09 MED ORDER — DIPHENHYDRAMINE HCL 50 MG/ML IJ SOLN: 12.5000 mg | INTRAMUSCULAR | Status: DC | PRN

## 2018-10-09 MED ORDER — EPHEDRINE 5 MG/ML INJ: 10.0000 mg | INTRAVENOUS | Status: DC | PRN

## 2018-10-09 MED ORDER — TRANEXAMIC ACID-NACL 1000-0.7 MG/100ML-% IV SOLN: 1000.0000 mg | INTRAVENOUS | Status: DC

## 2018-10-09 MED ORDER — LACTATED RINGERS IV SOLN
500.0000 mL | Freq: Once | INTRAVENOUS | Status: AC
  Administered 2018-10-09: 250 mL via INTRAVENOUS

## 2018-10-09 MED ORDER — SODIUM CHLORIDE (PF) 0.9 % IJ SOLN
INTRAMUSCULAR | Status: DC | PRN
  Administered 2018-10-09: 12 mL/h via EPIDURAL

## 2018-10-09 MED ORDER — LIDOCAINE HCL (PF) 1 % IJ SOLN
INTRAMUSCULAR | Status: DC | PRN
  Administered 2018-10-09: 5 mL via EPIDURAL

## 2018-10-09 MED ORDER — FENTANYL-BUPIVACAINE-NACL 0.5-0.125-0.9 MG/250ML-% EP SOLN: 12.0000 mL/h | EPIDURAL | Status: DC | PRN

## 2018-10-09 NOTE — Progress Notes (Signed)
LABOR PROGRESS NOTE  Kajol Crispen is a 33 y.o. G3P1011 at [redacted]w[redacted]d  admitted for IOL for PReE w SF.  Subjective: Feeling very tired but much better pain wise since epidural  Objective: BP (!) 169/101   Pulse 76   Temp 98.1 F (36.7 C) (Oral)   Resp 16   Ht 5\' 4"  (1.626 m)   Wt 75.8 kg   LMP 01/06/2018 (Exact Date)   SpO2 99%   BMI 28.67 kg/m  or  Vitals:   10/09/18 2035 10/09/18 2040 10/09/18 2045 10/09/18 2055  BP: (!) 153/93 (!) 157/94 (!) 169/101   Pulse: 70 63 76   Resp:      Temp:      TempSrc:      SpO2: 97% 99% 100% 99%  Weight:      Height:         Dilation: 6 Effacement (%): 90 Cervical Position: Middle Station: -1 Presentation: Vertex Exam by:: Colman Cater CNM FHT: baseline rate 130, minimal to moderate varibility, +acel, early decel Toco: q2-3 min  Labs: Lab Results  Component Value Date   WBC 12.2 (H) 10/09/2018   HGB 12.9 10/09/2018   HCT 38.9 10/09/2018   MCV 88.2 10/09/2018   PLT 256 10/09/2018    Patient Active Problem List   Diagnosis Date Noted  . Pre-eclampsia, severe, antepartum, third trimester 10/08/2018  . Encounter for induction of labor 10/08/2018  . Language barrier 09/16/2018  . Supervision of high risk pregnancy, antepartum 04/08/2018  . History of cesarean delivery 04/08/2018  . Hx of preeclampsia, prior pregnancy, currently pregnant, second trimester 04/08/2018  . History of preterm premature rupture of membranes (PPROM) 04/08/2018    Assessment / Plan: 33 y.o. G3P1011 at [redacted]w[redacted]d here for IOL for PreE w SF.  Labor: s/p FB, on pit since 0645, SROM @ 2045, making progress. Check again if more pressure or 4 hours, consider IUPC pending progress. Fetal Wellbeing:  Cat I to II, monitor closely Pain Control:  epidural Anticipated MOD:  SVD PreE w SF: normal to mild range pressures mostly over course of day with single severe range. On Mag gtt, IV HTN meds PRN, consider long acting PO if pressures are persistently close to  severe range and delivery not imminent.   Augustin Coupe, MD/MPH OB Fellow  10/09/2018, 9:12 PM

## 2018-10-09 NOTE — Progress Notes (Signed)
LABOR PROGRESS NOTE  Emily Wiley is a 33 y.o. G3P1011 at [redacted]w[redacted]d  admitted for IOL for PreE w SF.  Subjective: Able to get some rest after fentanyl Continuing to have painful contractions FB still in place  Objective: BP 135/81   Pulse 73   Temp 98.1 F (36.7 C) (Oral)   Resp 16   Ht 5\' 4"  (1.626 m)   Wt 75.8 kg   LMP 01/06/2018 (Exact Date)   SpO2 100%   BMI 28.67 kg/m  or  Vitals:   10/09/18 0401 10/09/18 0500 10/09/18 0600 10/09/18 0700  BP: (!) 111/58 114/67 110/74 135/81  Pulse: 75 69 70 73  Resp:  16 16 16   Temp:   98.1 F (36.7 C)   TempSrc:   Oral   SpO2:      Weight:      Height:         Dilation: 2 Cervical Position: Middle Station: Ballotable Presentation: Undeterminable Exam by:: B Boyer RN  FHT: baseline rate 130, moderate varibility, -acel, -decel Toco: q6-7 min  Labs: Lab Results  Component Value Date   WBC 8.3 10/08/2018   HGB 13.0 10/08/2018   HCT 38.8 10/08/2018   MCV 89.0 10/08/2018   PLT 261 10/08/2018    Patient Active Problem List   Diagnosis Date Noted  . Pre-eclampsia, severe, antepartum, third trimester 10/08/2018  . Encounter for induction of labor 10/08/2018  . Language barrier 09/16/2018  . Supervision of high risk pregnancy, antepartum 04/08/2018  . History of cesarean delivery 04/08/2018  . Hx of preeclampsia, prior pregnancy, currently pregnant, second trimester 04/08/2018  . History of preterm premature rupture of membranes (PPROM) 04/08/2018    Assessment / Plan: 32 y.o. G3P1011 at [redacted]w[redacted]d here for IOL for PreE w SF.  Labor: TOLAC, s/p FB placement at 2130 still in place but per RN exam now ~2cm. Pit started at [place]. Fetal Wellbeing:  Cat I, reassuring though has intermittent periods of Cat II, ctm Pain Control:  Planning epidural, IV pain meds for now Anticipated MOD:  SVD PreE w SF: BP's normal to mild range since arrival to L&D. Cont Mg gtt, anti-HTN meds PRN.  COVID: admission swab negative  Augustin Coupe, MD/MPH OB Fellow  10/09/2018, 7:29 AM

## 2018-10-09 NOTE — Progress Notes (Addendum)
LABOR PROGRESS NOTE  Emily Wiley is a 33 y.o. G3P1011 at [redacted]w[redacted]d  admitted for IOL for PreE w SF by BP.   Subjective: FB still in place but thinks her mucus plug passed Starting to feel painful contractions, would like something for pain so that she can sleep Denies headache, vision changes, chest pain, SOB  Objective: BP (!) 147/92   Pulse 71   Temp 97.9 F (36.6 C) (Oral)   Resp 16   Ht 5\' 4"  (1.626 m)   Wt 75.8 kg   LMP 01/06/2018 (Exact Date)   SpO2 100%   BMI 28.67 kg/m  or  Vitals:   10/09/18 0039 10/09/18 0135 10/09/18 0200 10/09/18 0300  BP: 127/71 (!) 134/96 134/78 (!) 147/92  Pulse: 79 79 76 71  Resp: 16 16 16    Temp: 97.9 F (36.6 C)     TempSrc: Oral     SpO2:      Weight:      Height:         Dilation: Fingertip Cervical Position: Middle Station: Ballotable Exam by:: Claretha Cooper, CNM FHT: baseline rate 140, moderate varibility, -acel, -decel Toco: q4 min ctx  Labs: Lab Results  Component Value Date   WBC 8.3 10/08/2018   HGB 13.0 10/08/2018   HCT 38.8 10/08/2018   MCV 89.0 10/08/2018   PLT 261 10/08/2018    Patient Active Problem List   Diagnosis Date Noted  . Pre-eclampsia, severe, antepartum, third trimester 10/08/2018  . Encounter for induction of labor 10/08/2018  . Language barrier 09/16/2018  . Supervision of high risk pregnancy, antepartum 04/08/2018  . History of cesarean delivery 04/08/2018  . Hx of preeclampsia, prior pregnancy, currently pregnant, second trimester 04/08/2018  . History of preterm premature rupture of membranes (PPROM) 04/08/2018    Assessment / Plan: 33 y.o. G3P1011 at [redacted]w[redacted]d here for IOL for PreE w SF.  Labor: TOLAC, s/p FB placement at 2130. Mucous plug possible passed. Will defer pitocin at this time until cervix more favorable but start pit if FB out or after 12 hours.  Fetal Wellbeing:  Cat I, reassuring Pain Control:  Planning epidural, IV fentanyl for now>stadol if unable to get  relief Anticipated MOD:  SVD PreE w SF: BP's normalized since arrival to L&D. Cont Mg gtt, anti-HTN meds PRN.  COVID: admission swab negative  Augustin Coupe, MD/MPH OB Fellow  10/09/2018, 3:05 AM

## 2018-10-09 NOTE — Progress Notes (Addendum)
Emily Seidner Gomezis a 33 y.o.G3P1011 at [redacted]w[redacted]d admitted for IOL for PreE w SF.  Subjective: Feeling stronger contractions now.   Objective: BP (!) 162/95   Pulse 79   Temp 98.4 F (36.9 C) (Oral)   Resp 16   Ht 5\' 4"  (1.626 m)   Wt 75.8 kg   LMP 01/06/2018 (Exact Date)   SpO2 100%   BMI 28.67 kg/m  I/O last 3 completed shifts: In: 1825.7 [P.O.:180; I.V.:1645.7] Out: 1050 [Urine:1050] Total I/O In: 1903.8 [P.O.:360; I.V.:1543.8] Out: 2650 [Urine:2650]  FHT:  FHR: 135 bpm, variability: moderate,  accelerations:  Present,  decelerations:  Present 1 X variable decel, then no furhter   UC:   regular, every 2-4 minutes SVE:   Dilation: 6 Effacement (%): 60 Station: -1 Exam by:: Colman Cater CNM  Labs: Lab Results  Component Value Date   WBC 11.3 (H) 10/09/2018   HGB 12.7 10/09/2018   HCT 37.6 10/09/2018   MCV 87.9 10/09/2018   PLT 234 10/09/2018    Assessment / Plan: 33 y.o.G3P1011 at [redacted]w[redacted]d here for IOL for PreE with severe features.  Labor: Labor progressing well. S/p FB placement and pitocin. Currently Pitocin at 20, continue at this dose.  Preeclampsia:  on magnesium sulfate and no signs or symptoms of toxicity Bps 150-160s. Repeat BP and if in severe range will need IV labetalol. Fetal Wellbeing:  Category I Pain Control:  IV pain meds considering epidural in active stage of labor  I/D:  n/a gbs negative  Anticipated MOD:  NSVD anticipated. Progress to c-section if maternal/fetal indication.   Emily Haw MD PGY1,  10/09/2018, 5:39 PM

## 2018-10-09 NOTE — Anesthesia Procedure Notes (Signed)
Epidural Patient location during procedure: OB Start time: 10/09/2018 8:12 PM End time: 10/09/2018 8:18 PM  Staffing Anesthesiologist: Effie Berkshire, MD Performed: anesthesiologist   Preanesthetic Checklist Completed: patient identified, site marked, surgical consent, pre-op evaluation, timeout performed, IV checked, risks and benefits discussed and monitors and equipment checked  Epidural Patient position: sitting Prep: ChloraPrep Patient monitoring: heart rate, continuous pulse ox and blood pressure Approach: midline Location: L3-L4 Injection technique: LOR saline  Needle:  Needle type: Tuohy  Needle gauge: 17 G Needle length: 9 cm Catheter type: closed end flexible Catheter size: 20 Guage Test dose: negative and 1.5% lidocaine  Assessment Events: blood not aspirated, injection not painful, no injection resistance and no paresthesia  Additional Notes LOR @ 4  Patient identified. Risks/Benefits/Options discussed with patient including but not limited to bleeding, infection, nerve damage, paralysis, failed block, incomplete pain control, headache, blood pressure changes, nausea, vomiting, reactions to medications, itching and postpartum back pain. Confirmed with bedside nurse the patient's most recent platelet count. Confirmed with patient that they are not currently taking any anticoagulation, have any bleeding history or any family history of bleeding disorders. Patient expressed understanding and wished to proceed. All questions were answered. Sterile technique was used throughout the entire procedure. Please see nursing notes for vital signs. Test dose was given through epidural catheter and negative prior to continuing to dose epidural or start infusion. Warning signs of high block given to the patient including shortness of breath, tingling/numbness in hands, complete motor block, or any concerning symptoms with instructions to call for help. Patient was given instructions on  fall risk and not to get out of bed. All questions and concerns addressed with instructions to call with any issues or inadequate analgesia.    Reason for block:procedure for pain

## 2018-10-09 NOTE — Progress Notes (Signed)
Treasure Ochs is a 33 y.o. G3P1011 at [redacted]w[redacted]d  admitted for IOL for PreE w SF.  *In person Spanish interpretor used*   Subjective: Feeling mild contractions, offered analgesia but refusing currently. Has frontal headache but thinks this is due to lack of sleep and not eating.Denies RUQ pain, visual disturbance or LE edema. Would like epidural when in active labor.   Objective: BP (!) 147/82   Pulse 78   Temp 98.1 F (36.7 C) (Oral)   Resp 16   Ht 5\' 4"  (1.626 m)   Wt 75.8 kg   LMP 01/06/2018 (Exact Date)   SpO2 100%   BMI 28.67 kg/m  I/O last 3 completed shifts: In: 1825.7 [P.O.:180; I.V.:1645.7] Out: 1050 [Urine:1050] Total I/O In: 273.3 [P.O.:120; I.V.:153.3] Out: 700 [Urine:700]  FHT:  FHR: 135 bpm, variability: moderate,  accelerations:  Present,  decelerations:  Absent UC:   regular, every 2-3 minutes SVE:   Dilation: 5 Effacement (%): 50 Station: -2 Exam by:: [employer] RNC  Labs: Lab Results  Component Value Date   WBC 11.3 (H) 10/09/2018   HGB 12.7 10/09/2018   HCT 37.6 10/09/2018   MCV 87.9 10/09/2018   PLT 234 10/09/2018    Assessment / Plan: 33 y.o.G3P1011 at [redacted]w[redacted]d here for IOL for PreE with severe features.  Labor: Progressing normally s/p FB placement at 21:30, fell out mid morning. Now 5cm dilated. Continue Pitcoin (started at 06:45), currently at 16mg . Consider AROM. Preeclampsia:  on magnesium sulfate and no signs or symptoms of toxicity Bps 350-093G systolic. Continue Mg drip and PRN anti-hypertensive  Fetal Wellbeing:  Category I Pain Control:  IV pain meds would like epidural in active stage of labor I/D:  n/a Anticipated MOD:  NSVD  Anticipated. Will progress to c-section if maternal/fetal indication.  [facility]  10/09/2018, 12:08 PM

## 2018-10-09 NOTE — Anesthesia Preprocedure Evaluation (Signed)
Anesthesia Evaluation  Patient identified by MRN, date of birth, ID band Patient awake    Reviewed: Allergy & Precautions, NPO status , Patient's Chart, lab work & pertinent test results  Airway Mallampati: I       Dental no notable dental hx.    Pulmonary neg pulmonary ROS,    Pulmonary exam normal        Cardiovascular hypertension, Normal cardiovascular exam     Neuro/Psych negative neurological ROS     GI/Hepatic negative GI ROS, Neg liver ROS,   Endo/Other  negative endocrine ROS  Renal/GU negative Renal ROS     Musculoskeletal negative musculoskeletal ROS (+)   Abdominal Normal abdominal exam  (+)   Peds  Hematology negative hematology ROS (+)   Anesthesia Other Findings   Reproductive/Obstetrics (+) Pregnancy                             Anesthesia Physical Anesthesia Plan  ASA: II  Anesthesia Plan: Epidural   Post-op Pain Management:    Induction:   PONV Risk Score and Plan:   Airway Management Planned:   Additional Equipment: None  Intra-op Plan:   Post-operative Plan:   Informed Consent: I have reviewed the patients History and Physical, chart, labs and discussed the procedure including the risks, benefits and alternatives for the proposed anesthesia with the patient or authorized representative who has indicated his/her understanding and acceptance.       Plan Discussed with:   Anesthesia Plan Comments:         Anesthesia Quick Evaluation

## 2018-10-10 DIAGNOSIS — Z3A37 37 weeks gestation of pregnancy: Secondary | ICD-10-CM

## 2018-10-10 DIAGNOSIS — O34219 Maternal care for unspecified type scar from previous cesarean delivery: Secondary | ICD-10-CM

## 2018-10-10 DIAGNOSIS — O1414 Severe pre-eclampsia complicating childbirth: Secondary | ICD-10-CM

## 2018-10-10 LAB — CBC WITH DIFFERENTIAL/PLATELET
Abs Immature Granulocytes: 0.11 10*3/uL — ABNORMAL HIGH (ref 0.00–0.07)
Basophils Absolute: 0 10*3/uL (ref 0.0–0.1)
Basophils Relative: 0 %
Eosinophils Absolute: 0 10*3/uL (ref 0.0–0.5)
Eosinophils Relative: 0 %
HCT: 30.2 % — ABNORMAL LOW (ref 36.0–46.0)
Hemoglobin: 9.9 g/dL — ABNORMAL LOW (ref 12.0–15.0)
Immature Granulocytes: 1 %
Lymphocytes Relative: 13 %
Lymphs Abs: 2.2 10*3/uL (ref 0.7–4.0)
MCH: 29.3 pg (ref 26.0–34.0)
MCHC: 32.8 g/dL (ref 30.0–36.0)
MCV: 89.3 fL (ref 80.0–100.0)
Monocytes Absolute: 0.9 10*3/uL (ref 0.1–1.0)
Monocytes Relative: 6 %
Neutro Abs: 13.4 10*3/uL — ABNORMAL HIGH (ref 1.7–7.7)
Neutrophils Relative %: 80 %
Platelets: 233 10*3/uL (ref 150–400)
RBC: 3.38 MIL/uL — ABNORMAL LOW (ref 3.87–5.11)
RDW: 14.6 % (ref 11.5–15.5)
WBC: 16.6 10*3/uL — ABNORMAL HIGH (ref 4.0–10.5)
nRBC: 0 % (ref 0.0–0.2)

## 2018-10-10 LAB — MAGNESIUM: Magnesium: 6.7 mg/dL (ref 1.7–2.4)

## 2018-10-10 MED ORDER — WITCH HAZEL-GLYCERIN EX PADS: 1.0000 "application " | MEDICATED_PAD | CUTANEOUS | Status: DC | PRN

## 2018-10-10 MED ORDER — DIBUCAINE (PERIANAL) 1 % EX OINT: 1.0000 "application " | TOPICAL_OINTMENT | CUTANEOUS | Status: DC | PRN

## 2018-10-10 MED ORDER — ASPIRIN 81 MG PO CHEW
81.0000 mg | CHEWABLE_TABLET | Freq: Every day | ORAL | Status: DC
  Administered 2018-10-10 – 2018-10-11 (×2): 81 mg via ORAL
  Filled 2018-10-10 (×2): qty 1

## 2018-10-10 MED ORDER — SIMETHICONE 80 MG PO CHEW: 80.0000 mg | CHEWABLE_TABLET | ORAL | Status: DC | PRN

## 2018-10-10 MED ORDER — MAGNESIUM SULFATE 40 G IN LACTATED RINGERS - SIMPLE
2.0000 g/h | INTRAVENOUS | Status: AC
  Administered 2018-10-10: 10:00:00 2 g/h via INTRAVENOUS
  Filled 2018-10-10: qty 500

## 2018-10-10 MED ORDER — PRENATAL MULTIVITAMIN CH
1.0000 | ORAL_TABLET | Freq: Every day | ORAL | Status: DC
  Administered 2018-10-10 – 2018-10-11 (×2): 1 via ORAL
  Filled 2018-10-10 (×2): qty 1

## 2018-10-10 MED ORDER — ACETAMINOPHEN 325 MG PO TABS: 650.0000 mg | ORAL_TABLET | ORAL | Status: DC | PRN

## 2018-10-10 MED ORDER — ONDANSETRON HCL 4 MG/2ML IJ SOLN: 4.0000 mg | INTRAMUSCULAR | Status: DC | PRN

## 2018-10-10 MED ORDER — SENNOSIDES-DOCUSATE SODIUM 8.6-50 MG PO TABS
2.0000 | ORAL_TABLET | ORAL | Status: DC
  Administered 2018-10-10: 2 via ORAL
  Filled 2018-10-10: qty 2

## 2018-10-10 MED ORDER — IBUPROFEN 600 MG PO TABS
600.0000 mg | ORAL_TABLET | Freq: Four times a day (QID) | ORAL | Status: DC
  Administered 2018-10-10 – 2018-10-11 (×6): 600 mg via ORAL
  Filled 2018-10-10 (×6): qty 1

## 2018-10-10 MED ORDER — LACTATED RINGERS IV SOLN
INTRAVENOUS | Status: DC
  Administered 2018-10-10 (×2): via INTRAVENOUS

## 2018-10-10 MED ORDER — ONDANSETRON HCL 4 MG PO TABS: 4.0000 mg | ORAL_TABLET | ORAL | Status: DC | PRN

## 2018-10-10 MED ORDER — DIPHENHYDRAMINE HCL 25 MG PO CAPS: 25.0000 mg | ORAL_CAPSULE | Freq: Four times a day (QID) | ORAL | Status: DC | PRN

## 2018-10-10 MED ORDER — BENZOCAINE-MENTHOL 20-0.5 % EX AERO: 1.0000 "application " | INHALATION_SPRAY | CUTANEOUS | Status: DC | PRN

## 2018-10-10 MED ORDER — MAGNESIUM HYDROXIDE 400 MG/5ML PO SUSP: 30.0000 mL | ORAL | Status: DC | PRN

## 2018-10-10 MED ORDER — COCONUT OIL OIL: 1.0000 "application " | TOPICAL_OIL | Status: DC | PRN

## 2018-10-10 NOTE — Anesthesia Postprocedure Evaluation (Signed)
Anesthesia Post Note  Patient: Emily Wiley  Procedure(s) Performed: AN AD [place]     Patient location during evaluation: Mother Baby Anesthesia Type: Epidural Level of consciousness: awake and alert Pain management: pain level controlled Vital Signs Assessment: post-procedure vital signs reviewed and stable Respiratory status: spontaneous breathing, nonlabored ventilation and respiratory function stable Cardiovascular status: stable Postop Assessment: no headache, no backache and epidural receding Anesthetic complications: no    Last Vitals:  Vitals:   10/10/18 0924 10/10/18 1000  BP:    Pulse:    Resp: 16 16  Temp:    SpO2:      Last Pain:  Vitals:   10/10/18 1009  TempSrc:   PainSc: 0-No pain   Pain Goal: Patients Stated Pain Goal: 3 (10/10/18 0130)              Epidural/Spinal Function Cutaneous sensation: Normal sensation (10/10/18 1009), Patient able to flex knees: Yes (10/10/18 1009), Patient able to lift hips off bed: Yes (10/10/18 1009), Back pain beyond tenderness at insertion site: No (10/10/18 1009), Progressively worsening motor and/or sensory loss: No (10/10/18 1009), Bowel and/or bladder incontinence post epidural: No (10/10/18 1009)  Ayaan Ringle

## 2018-10-10 NOTE — Plan of Care (Signed)

## 2018-10-10 NOTE — Plan of Care (Signed)
  Problem: Education: Goal: Knowledge of Childbirth will improve Outcome: Completed/Met Goal: Ability to make informed decisions regarding treatment and plan of care will improve Outcome: Completed/Met Goal: Ability to state and carry out methods to decrease the pain will improve Outcome: Completed/Met Goal: Individualized Educational Video(s) Outcome: Completed/Met   Problem: Coping: Goal: Ability to verbalize concerns and feelings about labor and delivery will improve Outcome: Completed/Met   Problem: Life Cycle: Goal: Ability to make normal progression through stages of labor will improve Outcome: Completed/Met Goal: Ability to effectively push during vaginal delivery will improve Outcome: Completed/Met   Problem: Role Relationship: Goal: Will demonstrate positive interactions with the child Outcome: Completed/Met   Problem: Safety: Goal: Risk of complications during labor and delivery will decrease Outcome: Completed/Met   Problem: Pain Management: Goal: Relief or control of pain from uterine contractions will improve Outcome: Completed/Met   Problem: Education: Goal: Knowledge of General Education information will improve Description: Including pain rating scale, medication(s)/side effects and non-pharmacologic comfort measures Outcome: Completed/Met   

## 2018-10-10 NOTE — Progress Notes (Signed)
CRITICAL VALUE ALERT  Critical Value: mag 6.7  Date & Time Notied:  10-10-18; 0845  Provider Notified: Dr. Elonda Husky  Orders Received/Actions taken: No new orders

## 2018-10-10 NOTE — Progress Notes (Signed)
Syncopal episode when up to bathroom. Pt was able to void before getting back to bed with assistance.

## 2018-10-10 NOTE — Discharge Summary (Signed)
Postpartum Discharge Summar     Patient Name: Emily Wiley DOB: 1985-05-28 MRN: [id number]  Date of admission: 10/08/2018 Delivering Provider: Clarnce Flock   Date of discharge: 10/11/2018  Admitting diagnosis: Pregnancy Intrauterine pregnancy: [redacted]w[redacted]d    Secondary diagnosis:  Active Problems:   History of cesarean delivery   Pre-eclampsia, severe, antepartum, third trimester   Encounter for induction of labor   OB Hemorrhage  Additional problems: none     Discharge diagnosis: VBAC                                                                                                Post partum procedures:none  Augmentation: Pitocin and Foley Balloon  Complications: [id number] Hospital course:  Induction of Labor With Vaginal Delivery   33y.o. yo [id number] admitted to the hospital 10/08/2018 for induction of labor.  Indication for induction: Preeclampsia.  Patient had an uncomplicated labor course as follows:   Patient arrived at fingertip dilation and was induced with foley bulb and pitocin. She had SROM for clear fluid shortly prior to delivery. She was on pitocin for approximately 28 hours at the time of delivery.  Membrane Rupture Time/Date: 8:45 PM ,10/09/2018   Intrapartum Procedures: Episiotomy: None [1]                                         Lacerations:  2nd degree [3]  Patient had delivery of a Viable infant.  Information for the patient's newborn:  Emily LovelessGirl ClRowena0[[id number]]Delivery Method: Vaginal, Spontaneous(Filed from Delivery Summary)    10/09/2018   Details of delivery can be found in separate delivery note but was notable for OB hemorrhage with QBL 1122 cc.    Patient had a routine postpartum course. Patient is discharged home 10/11/18. Delivery time: 10:26 PM    Magnesium Sulfate received: Yes BMZ received: No Rhophylac:N/A MMR:No Transfusion:No  Physical exam  Vitals:   10/11/18 0031 10/11/18 0503  10/11/18 0739 10/11/18 1109  BP: 124/64 128/76 103/64 140/80  Pulse: 73 70 63 67  Resp: 18 18 18 18   Temp: 98 F (36.7 C) 98.2 F (36.8 C) 97.6 F (36.4 C) 98 F (36.7 C)  TempSrc: Oral Oral Oral Oral  SpO2: 100% 100% 100% 100%  Weight:      Height:       General: alert, cooperative and no distress Lochia: appropriate Uterine Fundus: firm DVT Evaluation: No evidence of DVT seen on physical exam. Labs: Lab Results  Component Value Date   WBC 16.6 (H) 10/10/2018   HGB 9.9 (L) 10/10/2018   HCT 30.2 (L) 10/10/2018   MCV 89.3 10/10/2018   PLT 233 10/10/2018   CMP Latest Ref Rng & Units 10/08/2018  Glucose 70 - 99 mg/dL 114(H)  BUN 6 - 20 mg/dL 8  Creatinine 0.44 - 1.00 mg/dL 0.49  Sodium 135 - 145 mmol/L 136  Potassium 3.5 - 5.1 mmol/L 4.5  Chloride 98 - 111 mmol/L  107  CO2 22 - 32 mmol/L 19(L)  Calcium 8.9 - 10.3 mg/dL 8.9  Total Protein 6.5 - 8.1 g/dL 6.6  Total Bilirubin 0.3 - 1.2 mg/dL 0.4  Alkaline Phos 38 - 126 U/L 230(H)  AST 15 - 41 U/L 27  ALT 0 - 44 U/L 19    Discharge instruction: per After Visit Summary and "Baby and Me Booklet".  After visit meds:  Allergies as of 10/11/2018   No Known Allergies     Medication List    TAKE these medications   Abdominal Support/L-XL Misc 1 each by Does not apply route as needed.   acetaminophen 325 MG tablet Commonly known as: TYLENOL Take 650 mg by mouth every 6 (six) hours as needed.   aspirin 81 MG chewable tablet Chew 1 tablet (81 mg total) by mouth daily.   ibuprofen 600 MG tablet Commonly known as: ADVIL Take 1 tablet (600 mg total) by mouth every 6 (six) hours.   lisinopril 10 MG tablet Commonly known as: Prinivil Take 1 tablet (10 mg total) by mouth daily.   PRENATAL PO Take 1 tablet by mouth daily.       Diet: routine diet  Activity: Advance as tolerated. Pelvic rest for 6 weeks.   Outpatient follow up:1 week BP check Follow up Appt: Future Appointments  Date Time Provider [place]  10/16/2018  2:40 PM [place]  11/11/2018  2:35 PM Stann Mainland Katheran James, CNM [place]   Follow up Visit: [place] [facility] Follow up.   Specialty: Obstetrics and Gynecology Contact information: [address], [place] [id number] [place] [id number] [phone]          Please schedule this patient for Postpartum visit in: 1 week with the following provider: MD  For C/S patients schedule nurse incision check in weeks 2 weeks: no  High risk pregnancy complicated by: Pre-eclampsia with severe features  Delivery mode: VBAC  Anticipated Birth Control: Nexplanon  PP Procedures needed: BP check  Schedule Integrated [place] visit: no   Newborn Data: Live born female  Birth Weight: 6 lb 4.9 oz (2860 g) APGAR: 9, 9  Newborn Delivery   Birth date/time: 10/09/2018 22:26:00 Delivery type: Vaginal, Spontaneous      Baby Feeding: Breast and bottle Disposition:home with mother   10/11/2018 Donnamae Jude, MD

## 2018-10-10 NOTE — Lactation Note (Signed)
This note was copied from a baby's chart. Lactation Consultation Note  Patient Name: Emily Wiley IEPPI'R Date: 10/10/2018 Reason for consult: Initial assessment;Early term 53-38.6wks  Baby is 61 hours old  [place] called the [facility] interpreter and she recommended calling [facility] due to being being on [facility] with another patient.  [place] interpreter - Verdis Frederickson - [id number]/ [place]  Baby awake and rooting when [place] entered the room. Dad trying to use the bulb syrine due to the baby spitting small amount of formula.  LC reviewed the bulb syringe and burping the baby.  After diaper change LC assisted mom to hand express, several large drops and baby latched easily and fed for 12 mins/ increased with breast compressions.  Baby released on her own,  Mom expressed to the interpreter to the [facility] she was excited the baby fed well and she was comfortable.  LC attempted to latch on the other breast and baby was not interested . LC assisted to place baby STS with mom inside her gown.  LC explained to mom since her baby's birth weight was 6.4.9 oz , early term and she had significant blood loss , post pumping would be indicated.  Mom receptive. Mom had to go to the bathroom and needed assistance so LC called for help.  [place] asked the RN carrying for dyad to set up the DEBP.  LC placed the supplies in the room.  Per mom active with [place].    Maternal Data Has patient been taught Hand Expression?: Yes Does the patient have breastfeeding experience prior to this delivery?: Yes  Feeding    LATCH Score                   Interventions    Lactation Tools Discussed/Used WIC Program: Yes   Consult Status Consult Status: Follow-up Date: 10/11/18 Follow-up type: In-patient    [place] 10/10/2018, 10:00 AM

## 2018-10-10 NOTE — Progress Notes (Signed)
Syncopal episode when getting up to bathroom. Pt back to bed and pericare performed in bed.

## 2018-10-10 NOTE — Lactation Note (Signed)
This note was copied from a baby's chart. Lactation Consultation Note  Patient Name: Emily Wiley Today's Date: 10/10/2018 Reason for consult: Initial assessment;Early term 41-38.6wks  68 hours old ETI female who is being partially BF and formula fed by his mother, she's a P2. Mom had a PP hemorrhage but she's now feeling a little better. Baby was asleep in her bassinet, she had a teddy bear and lose blankets, educated parents about safe sleep.   Offered assistance with latch and mom agreed to wake baby up to feed. LC took baby STS to mother's left breast in cross cradle position but she kept switching to cradle. LC stressed the importance of holding the back of baby's neck for a deep latch. Baby would not open her mouth wide enough, latch could be deeper but she kept sucking on a nipple and part of the areola complex. LC had to reposition baby a few times during the feeding. No audible swallows noted but noticed long movements of the jaw which may indicate transfer, baby kept sucking in a long and nice rhythmical pattern. Baby still feeding when exiting the room at the 14 minutes mark.  Mom still had DEBP kit in the room, LC set it up, pump instructions, cleaning and storage were reviewed as well as milk storage guidelines. Dad present and very supportive. He told LC that he used to live in a farm and knew how to milk the cows and did teach back; he was very knowledge about lactation. He also agreed with mom when she said that she used to get 8 oz of milk just out of one breast, mom has Hx of oversupply with her first baby 10 years ago. She doesn't have a pump at home, [facility] showed mom how to use the DEBP as a single hand pump and as a double hand pump.  Reviewed normal newborn behavior, cluster feeding, feeding cues, pumping schedule and formula supplementation guidelines and storage. Parents aware they need to discard formula after an hour.  Feeding plan:  1. Encouraged mom to feed baby  STS 8-12 times/24 hours or sooner if feeding cues are present 2. Mom will start pumping after feedings (whenever she feels like it, she lost 1100 cc of blood) and will offer any amount of EBM to baby prior offering any formula 3. If no EBM is available, dad will offer baby a bottle of Gerber Gentle, starting with 5 ml at this time, baby is still spitty.  Parents reported all questions and concerns were answered, they're both aware of [place] OP services and will call PRN.  Maternal Data    Feeding Feeding Type: Breast Fed  LATCH Score Latch: Repeated attempts needed to sustain latch, nipple held in mouth throughout feeding, stimulation needed to elicit sucking reflex.  Audible Swallowing: None(but noticed long movements of the jaw)  Type of Nipple: Everted at rest and after stimulation  Comfort (Breast/Nipple): Soft / non-tender  Hold (Positioning): Assistance needed to correctly position infant at breast and maintain latch.  LATCH Score: 6  Interventions Interventions: Breast feeding basics reviewed;Assisted with latch;Skin to skin;Breast massage;Hand express;Breast compression;Support pillows;Adjust position;DEBP  Lactation Tools Discussed/Used Tools: Pump Breast pump type: Double-Electric Breast Pump Pump Review: Setup, frequency, and cleaning;Milk Storage Initiated by:: MPeck Date initiated:: 10/10/18   Consult Status Consult Status: Follow-up Date: 10/11/18 Follow-up type: In-patient    Stepfanie Yott Francene Boyers 10/10/2018, 6:50 PM

## 2018-10-10 NOTE — Progress Notes (Signed)
Post Partum Day 1 Subjective: no complaints  Objective: Blood pressure 126/61, pulse 72, temperature 98.3 F (36.8 C), temperature source Oral, resp. rate 18, height 5\' 4"  (1.626 m), weight 75.8 kg, last menstrual period 01/06/2018, SpO2 99 %, unknown if currently breastfeeding.  Physical Exam:  General: alert, cooperative and no distress Lochia: appropriate Uterine Fundus: firm Incision:  DVT Evaluation:   Recent Labs    10/09/18 1741 10/10/18 0648  HGB 12.9 9.9*  HCT 38.9 30.2*    Assessment/Plan: Continue magnesium sulfate for 24 hours BP is excellent at this point   LOS: 2 days   Florian Buff 10/10/2018, 8:04 AM

## 2018-10-11 MED ORDER — LISINOPRIL 10 MG PO TABS: 10.0000 mg | ORAL_TABLET | Freq: Every day | ORAL | 0 refills | Status: AC

## 2018-10-11 MED ORDER — IBUPROFEN 600 MG PO TABS: 600.0000 mg | ORAL_TABLET | Freq: Four times a day (QID) | ORAL | 0 refills | Status: AC

## 2018-10-11 MED FILL — LISINOPRIL 10 MG TABS: 10 | 30 days supply | Qty: 30 | Fill #0

## 2018-10-11 NOTE — Progress Notes (Signed)
Pt d/c home with baby, accompanied by FOB. Pt educated, with the assistance of an interpreter, on s/s of pre-eclampsia, postpartum care, medication regimen, newborn care, and follow-up appointments. Pt verbalized understanding.   Horton Finer, RN

## 2018-10-11 NOTE — Lactation Note (Signed)
This note was copied from a baby's chart. Lactation Consultation Note  Patient Name: Emily Wiley RUEAV'W Date: 10/11/2018 Reason for consult: Follow-up assessment;Early term 37-38.6wks;Infant weight loss  58 hours old ETI female who is being partially BF and formula fed by her mother, she's a P2. Mom and baby are going home today, baby is at 2% weight loss. Parents still having blankets and "Copito" (bear) in baby's crib, but they told LC that they only do it during the day when they're watching baby, but they won't do it at night time when baby is alone in her crib, they understand safe sleep practices.  Baby already nursing when entering the room, mom not doing STS but she's very experienced and baby was nursing just fine with audible swallows noted, latch could be deeper but mom states that feedings at the breast are still comfortable, baby started cluster feeding this morning. Reviewed discharge instructions, engorgement prevention and treatment, treatment/prevention for sore nipples and red flags on when to call baby's pediatrician.  Parents reported all questions and concerns were answered, they're both aware of [place] OP services and will call PRN.  Maternal Data    Feeding Feeding Type: Breast Fed  LATCH Score Latch: Repeated attempts needed to sustain latch, nipple held in mouth throughout feeding, stimulation needed to elicit sucking reflex.(baby falling asleep)  Audible Swallowing: A few with stimulation  Type of Nipple: Everted at rest and after stimulation  Comfort (Breast/Nipple): Soft / non-tender  Hold (Positioning): No assistance needed to correctly position infant at breast.  LATCH Score: 8  Interventions Interventions: Breast feeding basics reviewed  Lactation Tools Discussed/Used     Consult Status Consult Status: Complete Date: 10/11/18 Follow-up type: In-patient    [place] 10/11/2018, 1:20 PM

## 2018-10-11 NOTE — Discharge Instructions (Signed)
Hipertensin posparto Postpartum Hypertension La hipertensin posparto es el aumento de la presin arterial que se mantiene ms alta de lo normal despus del parto. Puede no darse cuenta de que tiene hipertensin posparto si no le miden la presin arterial con regularidad. En la [employer], la hipertensin posparto desaparece sola, por lo general en la semana posterior al parto. Sin embargo, algunas mujeres requieren tratamiento mdico para prevenir complicaciones graves, como convulsiones o un accidente cerebrovascular. Cules son las causas? Esta afeccin puede ser causada por uno o ms de lo siguiente:  Hipertensin que exista antes del embarazo (hipertensin crnica).  Hipertensin que surge como resultado del embarazo (hipertensin gestacional).  Trastornos hipertensivos Solicitor (preeclampsia) o convulsiones en las mujeres que tienen hipertensin arterial durante el embarazo (eclampsia).  Una afeccin en la que el hgado, las plaquetas y los glbulos rojos se daan durante el embarazo (sndrome de HELLP).  Una afeccin en la cual la glndula tiroidea produce demasiadas hormonas (hipertiroidismo).  Otros problemas poco frecuentes de los nervios (trastornos neurolgicos) o trastornos de Herbalist. En algunos casos, es posible que la causa se desconozca. Qu incrementa el riesgo? Los siguientes factores pueden hacer que usted sea ms propensa a tener esta afeccin:  Hipertensin crnica. En algunos casos, es posible que esta no se haya diagnosticado antes del embarazo.  Obesidad.  Diabetes tipo 2.  Enfermedad renal.  Antecedentes de preeclampsia o eclampsia.  Otras afecciones mdicas que modifican el nivel de hormonas en el cuerpo (desequilibrio hormonal). Cules son los signos o los sntomas? Al igual que con todos los tipos de hipertensin, la hipertensin posparto puede no causar ningn sntoma. Segn lo alta que est la presin arterial, puede  presentar lo siguiente:  Dolores de [place]. Estos pueden ser leves, moderados o intensos. Tambin pueden ser regulares, constantes o de inicio repentino (cefalea en estallido).  Cambios en la capacidad para ver (cambios visuales).  Mareos.  Falta de aire.  Hinchazn de [employer], los pies, la parte inferior de las piernas o el rostro. En algunos casos, puede tener hinchazn en varias de estas reas.  Palpitaciones o latidos cardacos acelerados.  Dificultad para respirar al Cleora Fleet.  Disminucin en la cantidad de orina que elimina. Otros signos y sntomas poco frecuentes pueden incluir lo siguiente:  Ms sudoracin que la habitual. Esta dura algo ms que [employer] del parto.  Dolor en el pecho.  Mareos repentinos al levantarse despus de haber estado sentada o [place].  Convulsiones.  Nuseas o vmitos.  Dolor abdominal. Cmo se diagnostica? Esta afeccin se puede diagnosticar en funcin de los resultados de un examen fsico, mediciones de la presin arteria, y [place] de [place] y [place]. Tambin puede someterse a otras pruebas, como una exploracin por tomografa computarizada (TC) o una resonancia magntica (RM) para Hydrographic surveyor otros problemas de la hipertensin posparto. Cmo se trata? Si la presin arterial est lo suficientemente alta como para requerir tratamiento, las opciones pueden incluir lo siguiente:  Medicamentos para disminuir la presin arterial (antihipertensivos). Dgale al mdico si est amamantando o si planifica hacerlo. Hay muchos medicamentos antihipertensivos que pueden tomarse sin riesgos durante la Transport planner.  Interrupcin de los [employer] puedan causar la hipertensin.  Tratamiento de las enfermedades que causan la hipertensin.  Tratamiento de las complicaciones de la hipertensin, como convulsiones, accidente cerebrovascular o problemas renales. El mdico seguir controlando atentamente la presin arterial hasta que esta se  encuentre en un nivel seguro para usted. Siga estas indicaciones en su  casa:  Tome los medicamentos de venta libre y los recetados solamente como se lo haya indicado el mdico.  Retome sus actividades normales como se lo haya indicado el mdico. Pregntele al mdico qu actividades son seguras para usted.  No consuma ningn producto que contenga nicotina o tabaco, como cigarrillos y Administrator, Civil Servicecigarrillos electrnicos. Si necesita ayuda para dejar de fumar, consulte al mdico.  Concurra a todas las visitas de control como se lo haya indicado el mdico. Esto es importante. Comunquese con un mdico si:  Los sntomas empeoran.  Aparecen nuevos sntomas, por ejemplo: ? Un dolor de cabeza que no mejora. ? Mareos. ? Cambios en la visin. Solicite ayuda de inmediato si:  Presenta hinchazn repentina Jabil Circuiten las manos, los tobillos o el rostro.  Tiene un aumento de peso repentino y rpido.  Tiene dificultad para respirar, dolor en el pecho, latidos cardacos acelerados o palpitaciones cardacas.  Siente dolor intenso en el abdomen.  Tiene sntomas de un accidente cerebrovascular. "BE FAST" es una manera fcil de recordar las principales seales de advertencia de un accidente cerebrovascular: ? B - Balance (equilibrio). Los signos son dificultad repentina para caminar o prdida del equilibrio. ? E - Eyes (ojos). Los signos son dificultad para ver o un cambio repentino en la visin. ? F - Face (rostro). Los signos son debilidad repentina o entumecimiento del rostro, o el rostro o el prpado que se caen hacia un lado. ? A - Arms (brazos). Los signos son debilidad o entumecimiento en un brazo. Esto sucede de repente y generalmente en un lado del cuerpo. ? S - Speech (habla). Los signos son dificultad para hablar, hablar arrastrando las palabras o dificultad para comprender lo que la gente dice. ? T - Time (tiempo). Es tiempo de Freight forwarderllamar a los servicios de Sports administratoremergencias. Escriba la hora en la que comenzaron los  sntomas.  Presenta otros signos de accidente cerebrovascular, como los siguientes: ? Dolor de cabeza sbito e intenso que no tiene causa aparente. ? Nuseas o vmitos. ? Convulsiones. Estos sntomas pueden representar un problema grave que constituye Radio broadcast assistantuna emergencia. No espere hasta que los sntomas desaparezcan. Solicite atencin mdica de inmediato. Comunquese con el servicio de emergencias de su localidad (911 en los [place]). No conduzca por sus propios medios Dollar Generalhasta el hospital. Resumen  La hipertensin posparto es el aumento de la presin arterial que se mantiene ms alta de lo normal despus del Meg[place]elparto.  En la International Business Machinesmayora de los casos, la hipertensin posparto desaparece sola, por lo general en la semana posterior al parto.  Algunas mujeres requieren tratamiento mdico para prevenir complicaciones graves, como convulsiones o un accidente cerebrovascular. Esta informacin no tiene Theme park managercomo fin reemplazar el consejo del mdico. Asegrese de hacerle al mdico cualquier pregunta que tenga. Document Released: 11/07/2013 Document Revised: 05/05/2017 Document Reviewed: 01/14/2017 Elsevier Patient Education  2020 [employer]

## 2018-10-15 ENCOUNTER — Other Ambulatory Visit: Payer: Self-pay

## 2018-10-15 ENCOUNTER — Ambulatory Visit (INDEPENDENT_AMBULATORY_CARE_PROVIDER_SITE_OTHER): Payer: Self-pay

## 2018-10-15 VITALS — BP 152/90 | Wt 153.1 lb

## 2018-10-15 DIAGNOSIS — Z0130 Encounter for examination of blood pressure without abnormal findings: Secondary | ICD-10-CM

## 2018-10-15 MED ORDER — LABETALOL HCL 200 MG PO TABS: 200.0000 mg | ORAL_TABLET | Freq: Two times a day (BID) | ORAL | 0 refills | Status: AC

## 2018-10-15 NOTE — Progress Notes (Signed)
Video Interpreter # [id number] Pt here today for BP check.  Pt reports that she has been having headaches and currently has one.  Pt states that she last took Lisinopril dose around noon today.  Notified Jorje Guild, NP and was advised that pt needs to add along with Lisinopril 10 mg tablet, Labetalol 200 mg po bid, rpt BP check on Friday 10/18/18, and I advised pt to continue to watch out for sx's HTN.  Pt verbalized understanding.   Mel Almond, RN 10/15/18

## 2018-10-16 ENCOUNTER — Ambulatory Visit: Payer: Self-pay

## 2018-10-16 NOTE — Progress Notes (Signed)
Chart reviewed for nurse visit. Agree with plan of care.  Reviewed patient with Dr. Rosana Hoes. Will add labetalol 200 mg BID to her antihypertensive regimen & have her return on Friday for a BP check. RN reviewed PEC precautions.   Jorje Guild, NP 10/16/2018 8:23 AM

## 2018-10-18 ENCOUNTER — Ambulatory Visit: Payer: Self-pay

## 2018-10-22 ENCOUNTER — Ambulatory Visit: Payer: Self-pay

## 2018-10-24 ENCOUNTER — Encounter: Payer: Self-pay | Admitting: Obstetrics & Gynecology

## 2018-10-28 ENCOUNTER — Other Ambulatory Visit: Payer: Self-pay

## 2018-10-28 ENCOUNTER — Encounter: Payer: Self-pay | Admitting: Advanced Practice Midwife

## 2018-11-11 ENCOUNTER — Ambulatory Visit: Payer: Self-pay | Admitting: Certified Nurse Midwife

## 2018-11-12 ENCOUNTER — Other Ambulatory Visit: Payer: Self-pay

## 2018-11-12 ENCOUNTER — Ambulatory Visit (INDEPENDENT_AMBULATORY_CARE_PROVIDER_SITE_OTHER): Payer: Self-pay | Admitting: Student

## 2018-11-12 DIAGNOSIS — O0990 Supervision of high risk pregnancy, unspecified, unspecified trimester: Secondary | ICD-10-CM

## 2018-11-12 NOTE — Progress Notes (Signed)
TELEHEALTH VIRTUAL POSTPARTUM VISIT ENCOUNTER NOTE  I connected with@ on 11/12/18 at  4:15 PM EDT by telephone at home and verified that I am speaking with the correct person using two identifiers.   I discussed the limitations, risks, security and privacy concerns of performing an evaluation and management service by telephone and the availability of in person appointments. I also discussed with the patient that there may be a patient responsible charge related to this service. The patient expressed understanding and agreed to proceed.  Appointment Date: 11/12/2018  OBGYN Clinic: [facility]  Chief Complaint:  Postpartum Visit  History of Present Illness: Emily [facility] is a 33 y.o. Hispanic [id number] (Patient's last menstrual period was 01/06/2018 (exact date).), seen for the above chief complaint. Her past medical history is significant for preeclampsia in third trimester.   She is s/p normal spontaneous vaginal delivery  at 37 weeks; she was discharged to home on D#4. Pregnancy complicated by pre-eclampsia and IOL. Baby is doing well.  Complains of nothing.   Vaginal bleeding or discharge: No  Mode of feeding infant: Breast Intercourse: No  Contraception: nexplanon PP depression s/s: No .  Any bowel or bladder issues: No  Pap smear: CIN-1 (date: Feb 2020)  Review of Systems: Positive for nothing. Her 12 point review of systems is negative or as noted in the History of Present Illness.  Patient Active Problem List   Diagnosis Date Noted  . OB Hemorrhage 10/10/2018  . Pre-eclampsia, severe, antepartum, third trimester 10/08/2018  . Encounter for induction of labor 10/08/2018  . Language barrier 09/16/2018  . Supervision of high risk pregnancy, antepartum 04/08/2018  . History of cesarean delivery 04/08/2018  . Hx of preeclampsia, prior pregnancy, currently pregnant, second trimester 04/08/2018  . History of preterm premature rupture of membranes (PPROM) 04/08/2018     Medications Mikya Miquel Dunn had no medications administered during this visit. Current Outpatient Medications  Medication Sig Dispense Refill  . acetaminophen (TYLENOL) 325 MG tablet Take 650 mg by mouth every 6 (six) hours as needed.    Marland Kitchen lisinopril (PRINIVIL) 10 MG tablet Take 1 tablet (10 mg total) by mouth daily. 30 tablet 0  . Prenatal Vit-Fe Fumarate-FA (PRENATAL PO) Take 1 tablet by mouth daily.     Marland Kitchen aspirin 81 MG chewable tablet Chew 1 tablet (81 mg total) by mouth daily. (Patient not taking: Reported on 10/15/2018) 90 tablet 1  . Elastic Bandages & Supports (ABDOMINAL SUPPORT/L-XL) MISC 1 each by Does not apply route as needed. (Patient not taking: Reported on 10/15/2018) 1 each 0  . ibuprofen (ADVIL) 600 MG tablet Take 1 tablet (600 mg total) by mouth every 6 (six) hours. (Patient not taking: Reported on 11/12/2018) 30 tablet 0  . labetalol (NORMODYNE) 200 MG tablet Take 1 tablet (200 mg total) by mouth 2 (two) times daily. 60 tablet 0   No current facility-administered medications for this visit.     Allergies Patient has no known allergies.  Physical Exam:  General:  Alert, oriented and cooperative.   Mental Status: Normal mood and affect perceived. Normal judgment and thought content.  Rest of physical exam deferred due to type of encounter  PP Depression Screening:   Edinburgh Postnatal Depression Scale - 11/12/18 1552      Edinburgh Postnatal Depression Scale:  In the Past 7 Days   I have been able to laugh and see the funny side of things.  0    I have looked forward  with enjoyment to things.  0    I have blamed myself unnecessarily when things went wrong.  0    I have been anxious or worried for no good reason.  0    I have felt scared or panicky for no good reason.  0    Things have been getting on top of me.  0    I have been so unhappy that I have had difficulty sleeping.  0    I have felt sad or miserable.  0    I have been so unhappy that I have been  crying.  1    The thought of harming myself has occurred to me.  0    Edinburgh Postnatal Depression Scale Total  1       Assessment:Patient is a 33 y.o. [id number] who is 4 weeks postpartum from a normal spontaneous vaginal delivery.  She is doing well.   Plan: -Plan for follow up pap at [facility] in February -Patient will contact [facility] for nexplanon.  -Laceration has healed well RTC  I discussed the assessment and treatment plan with the patient. The patient was provided an opportunity to ask questions and all were answered. The patient agreed with the plan and demonstrated an understanding of the instructions.   The patient was advised to call back or seek an in-person evaluation/go to the ED for any concerning postpartum symptoms.  I provided  minutes of non-face-to-face time during this encounter.   Starr Lake, [place] for [employer], [place]

## 2018-11-15 ENCOUNTER — Telehealth: Payer: Self-pay | Admitting: Student

## 2018-11-15 NOTE — Telephone Encounter (Signed)
Called patient for BP check and to confirm if she was still taking her BP medication (this was not clarified at her postpartum visit). She is not taking her BP medicine; she stopped last week. I had patient check her BP over the phone and it was 130/79. She is stable with no signs or symptoms; at this point patient is 5 weeks and 2 days postpartum; low risk for developing postpartum pre-e.  Emily Wiley

## 2020-08-12 IMAGING — US US MFM OB DETAIL +14 WK
1 series · 13 of 28 positions shown · non-contrast
Comparison: none

[Series 1: us mfm ob detail +14 wk · 88 acquisitions, 13 frames shown]
[im 4/88]
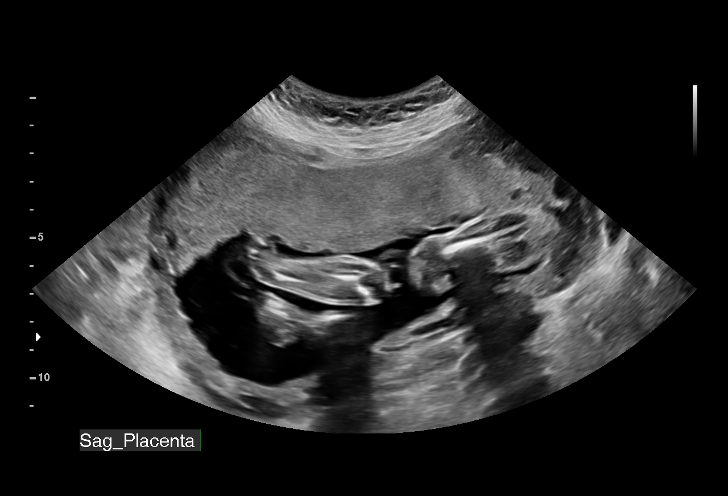
[im 10/88]
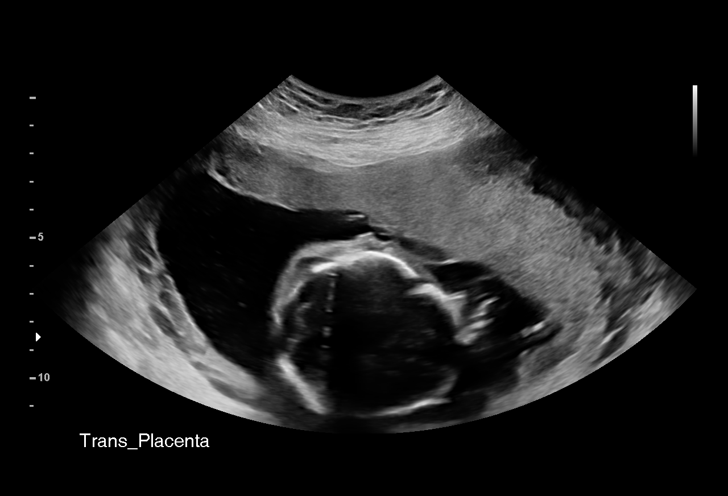
[im 17/88]
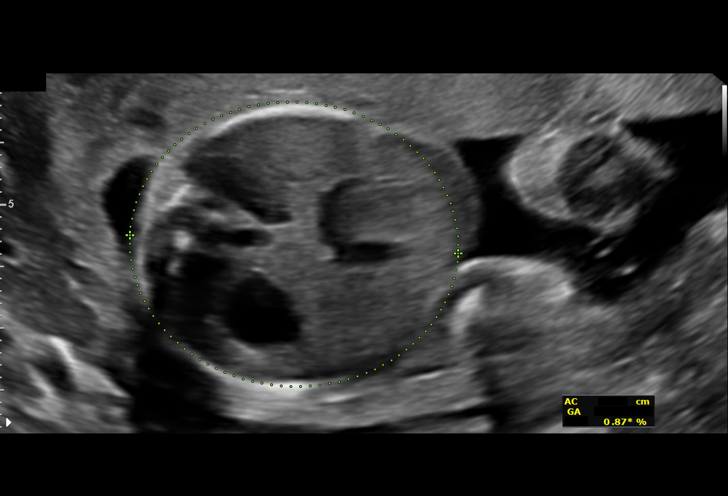
[im 23/88]
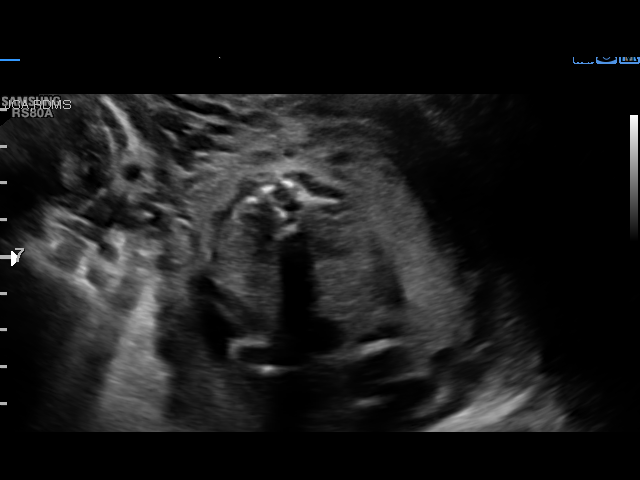
[im 30/88]
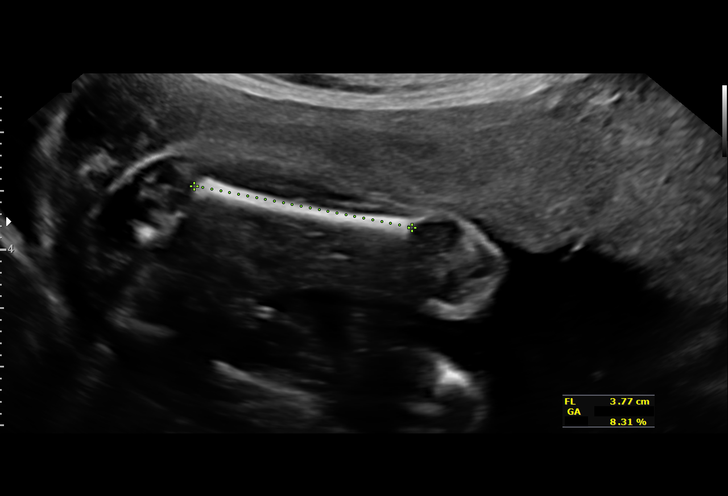
[im 36/88]
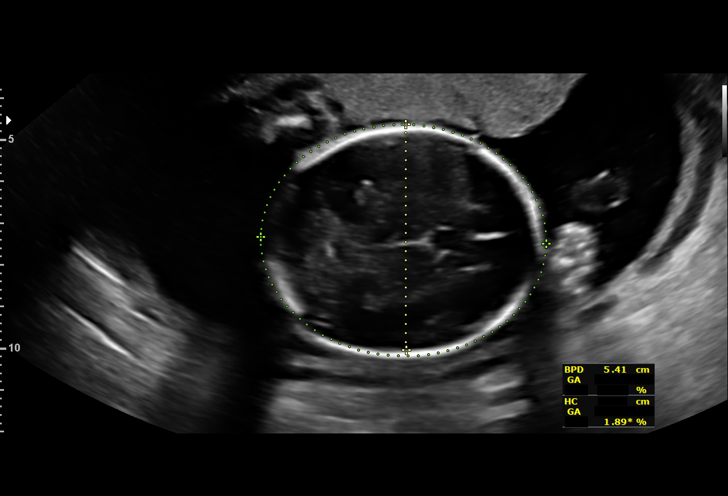
[im 46/88]
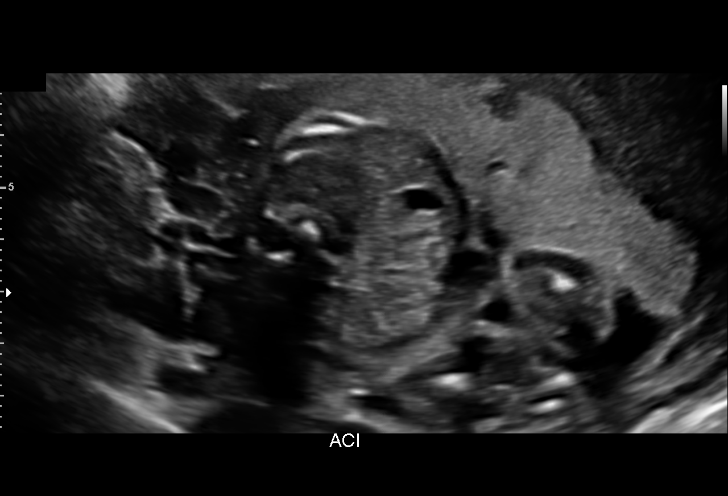
[im 52/88]
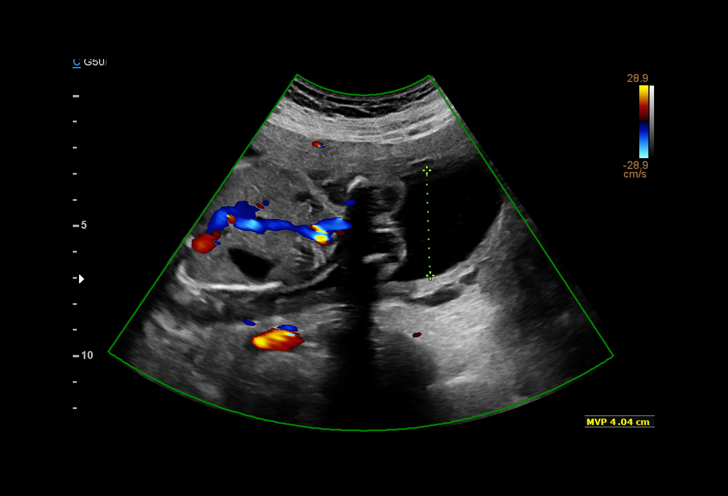
[im 59/88]
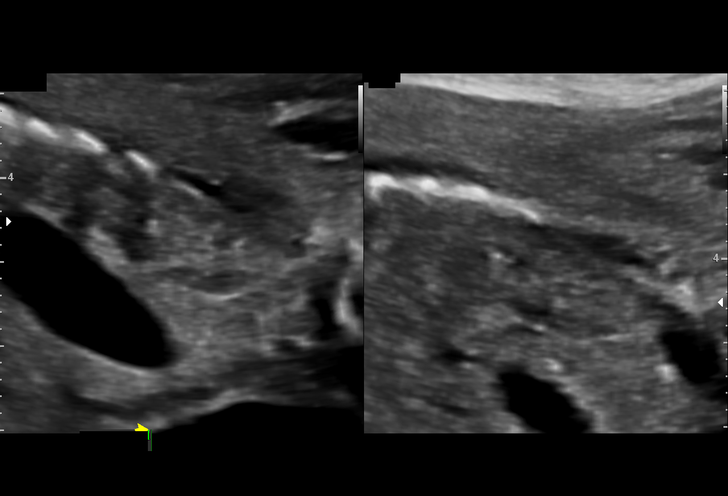
[im 65/88]
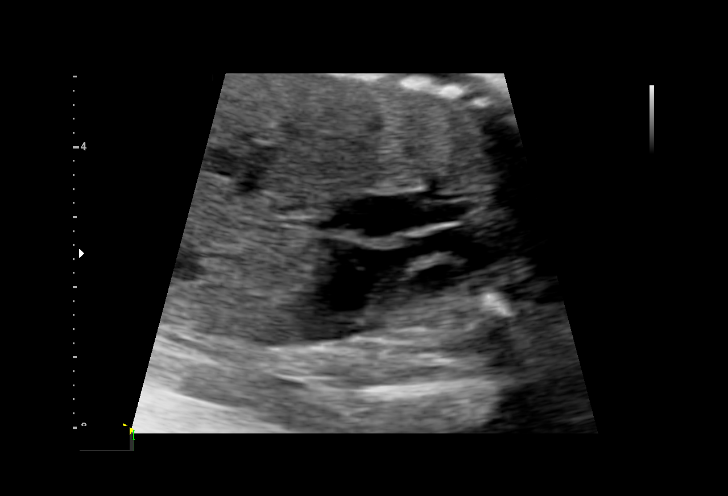
[im 71/88]
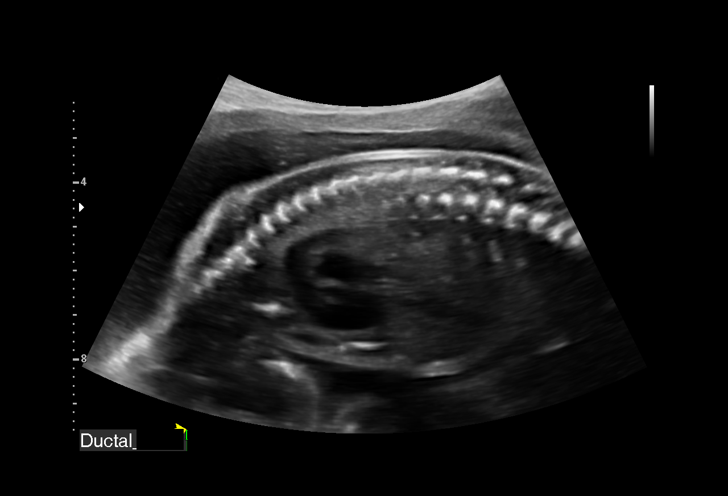
[im 78/88]
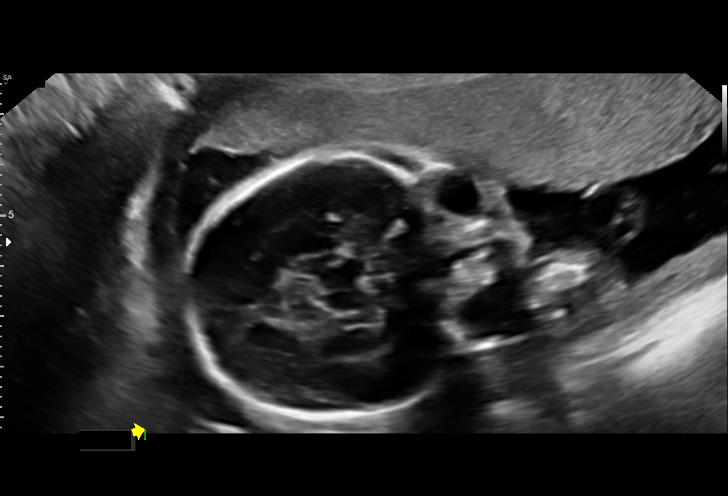
[im 84/88]
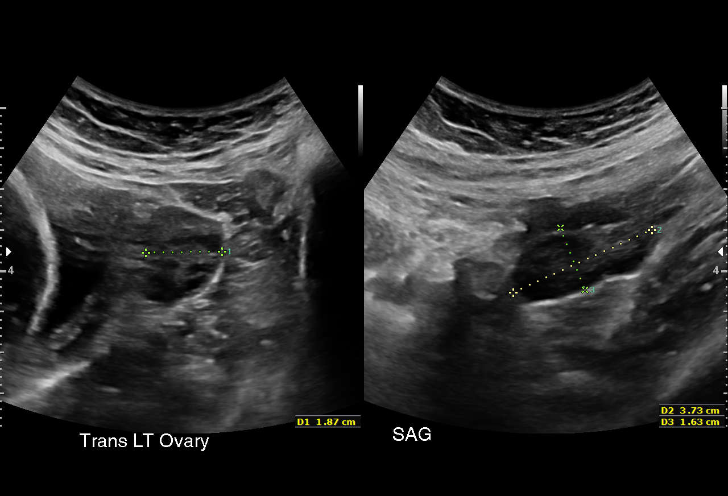

[13 of 28 positions shown; findings below may reference images not displayed]

[REDACTED]

  2  US MFM UA CORD DOPPLER               76820.02     RIGOLI
 ----------------------------------------------------------------------

 ----------------------------------------------------------------------
Indications

  Maternal care for known or suspected poor
  fetal growth, second trimester, not applicable
  or unspecified
  Previous cesarean delivery, antepartum
  Poor obstetrical history (PPROM, Term
  delivery @ 38 weeks)
  Poor obstetric history: Previous
  preeclampsia / eclampsia/gestational HTN
  23 weeks gestation of pregnancy
  Poor obstetric history: Previous fetal growth
  restriction (FGR) per patient in [REDACTED]
 ----------------------------------------------------------------------
Vital Signs

 BMI:
Fetal Evaluation

 Num Of Fetuses:         1
 Fetal Heart Rate(bpm):  145
 Cardiac Activity:       Observed
 Presentation:           Breech
 Placenta:               Anterior
 P. Cord Insertion:      Visualized, central
 Amniotic Fluid
 AFI FV:      Within normal limits

                             Largest Pocket(cm)

Biometry

 BPD:      53.6  mm     G. Age:  22w 2d         13  %    CI:         72.7   %    70 - 86
                                                         FL/HC:      18.3   %    19.2 -
 HC:      199.9  mm     G. Age:  22w 1d          6  %    HC/AC:      1.31        1.05 -
 AC:      152.5  mm     G. Age:  20w 3d        < 3  %    FL/BPD:     68.1   %    71 - 87
 FL:       36.5  mm     G. Age:  21w 4d          4  %    FL/AC:      23.9   %    20 - 24
 CER:      24.5  mm     G. Age:  22w 4d         37  %
 CM:        5.7  mm

 Est. FW:     402  gm    0 lb 14 oz      13  %
OB History

 Gravidity:    3         Term:   1        Prem:   0        SAB:   0
 TOP:          0       Ectopic:  1        Living: 1
Gestational Age

 LMP:           23w 2d        Date:  01/06/18                 EDD:   10/13/18
 U/S Today:     21w 4d                                        EDD:   10/25/18
 Best:          23w 2d     Det. By:  LMP  (01/06/18)          EDD:   10/13/18
Anatomy

 Cranium:               Appears normal         Aortic Arch:            Appears normal
 Cavum:                 Appears normal         Ductal Arch:            Appears normal
 Ventricles:            Appears normal         Diaphragm:              Appears normal
 Choroid Plexus:        Appears normal         Stomach:                Appears normal, left
                                                                       sided
 Cerebellum:            Appears normal         Abdomen:                Appears normal
 Posterior Fossa:       Appears normal         Abdominal Wall:         Appears nml (cord
                                                                       insert, abd wall)
 Nuchal Fold:           Not applicable (>20    Cord Vessels:           Appears normal (3
                        wks GA)                                        vessel cord)
 Face:                  Orbits nl; profile not Kidneys:                Appear normal
                        well visualized
 Lips:                  Appears normal         Bladder:                Appears normal
 Thoracic:              Appears normal         Spine:                  Ltd views no
                                                                       intracranial signs of
                                                                       NTD
 Heart:                 Not well visualized    Upper Extremities:      Previously seen
 RVOT:                  Appears normal         Lower Extremities:      Previously seen
 LVOT:                  Appears normal

 Other:  Female gender Heels and 5th digit visualized. Technically difficult due
         to fetal position.
Doppler - Fetal Vessels

 Umbilical Artery
  S/D     %tile
 5.63    >
Cervix Uterus Adnexa

 Cervix
 Length:           3.07  cm.
 Normal appearance by transabdominal scan.

 Uterus
 No abnormality visualized.

 Left Ovary
 Within normal limits.

 Right Ovary
 Within normal limits.

 Cul De Sac
 No free fluid seen.

 Adnexa
 No abnormality visualized.
Comments

 I discussed today's findings of fetal growth restriction in the
 midtrimester. We discussed the differential diagnosis of
 anueploidy, placental insufficiency and possible infection.
 The UA Dopplers are elevated but not absent or reversed.
 We reviewed the increased risk of preterm delivery.
Impression

 Fetal growth restriction observed today with elevated UA
 dopplers.
 No ultrasonic evidence of structural fetal anomalies.
 Suboptimal views of the fetal anatomy was obtained
 secondary to fetal position.
Recommendations

 Follow up UA Dopplers in 1 week.
 Follow up anatomy int 4-6 weeks.

## 2020-08-19 IMAGING — US US MFM UA CORD DOPPLER
1 series · 13 of 13 positions shown · non-contrast
Comparison: none

[Series 1: us mfm ua cord doppler · 13 of 13 slices shown]
[im 1/13]
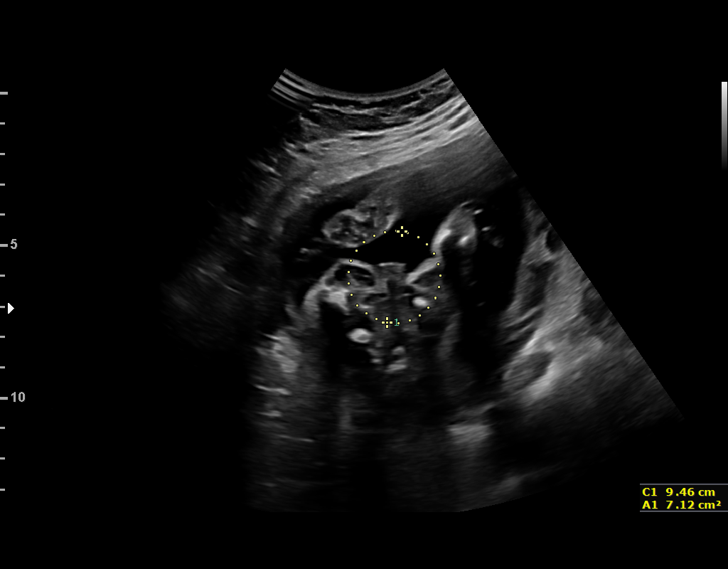
[im 2/13]
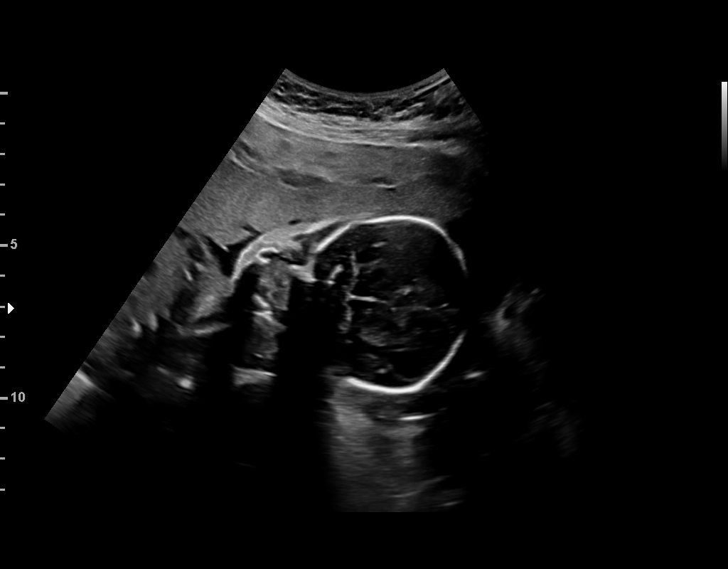
[im 3/13]
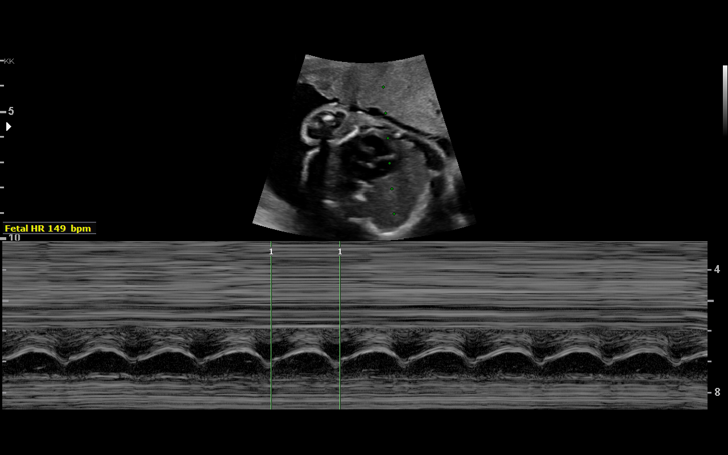
[im 4/13]
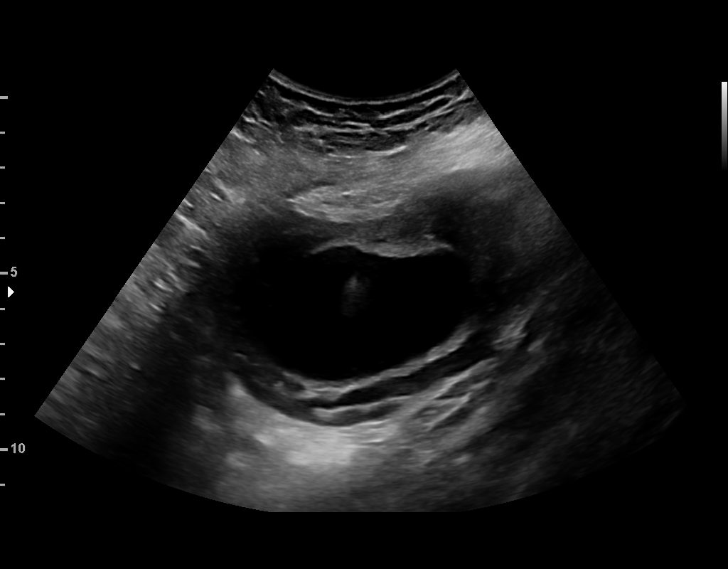
[im 5/13]
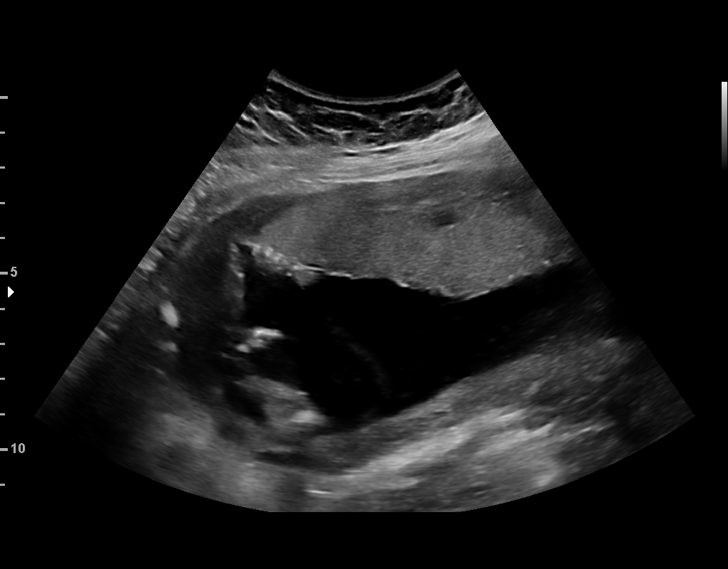
[im 6/13]
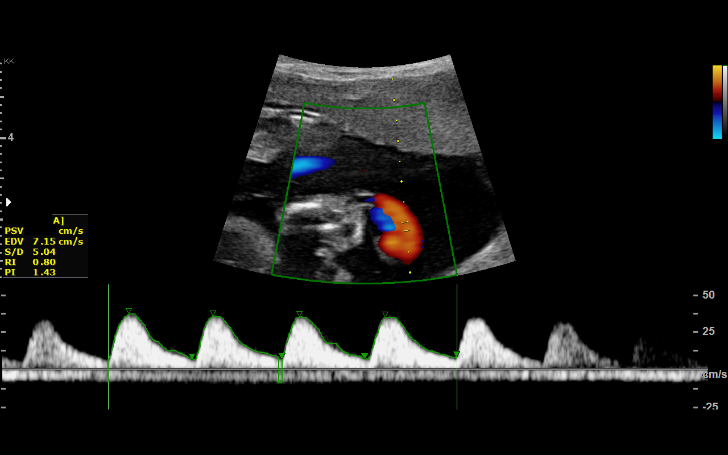
[im 7/13]
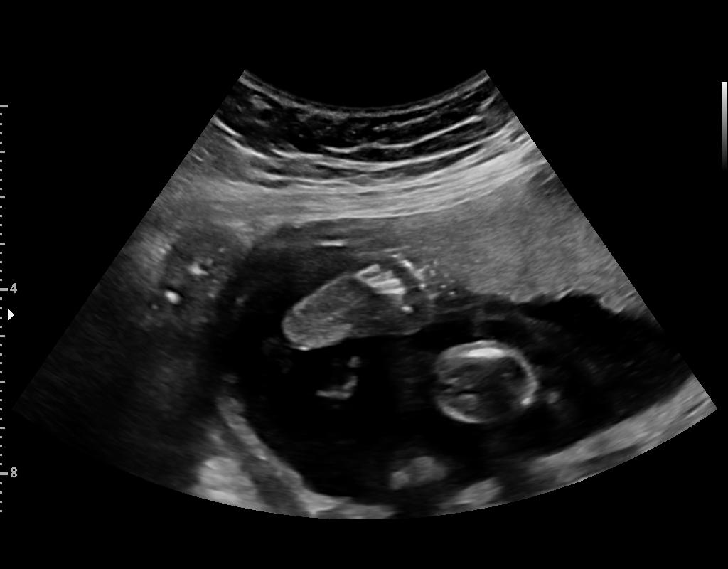
[im 8/13]
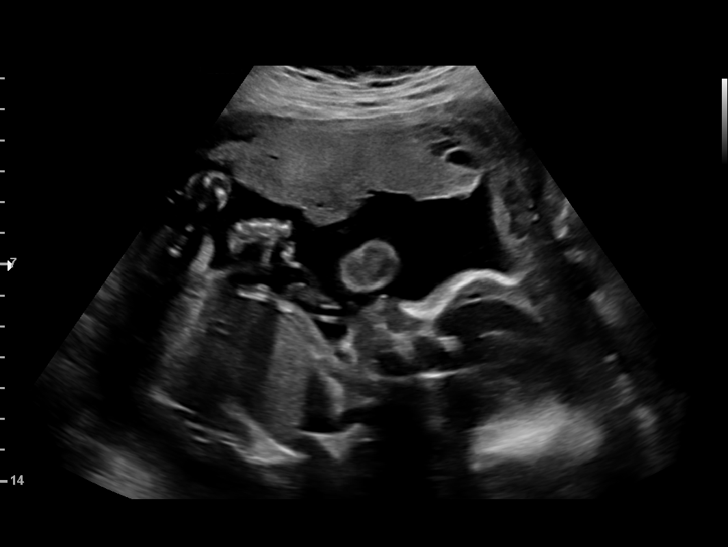
[im 9/13]
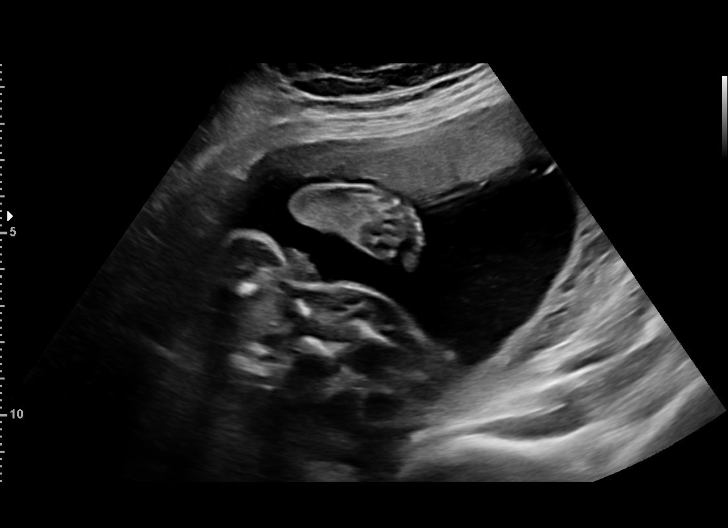
[im 10/13]
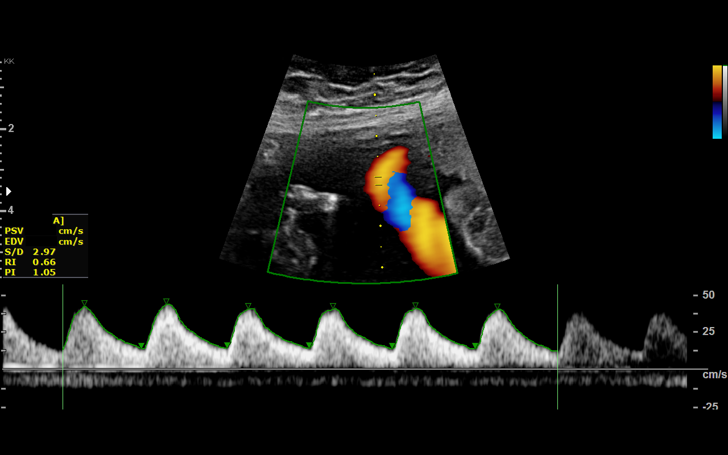
[im 11/13]
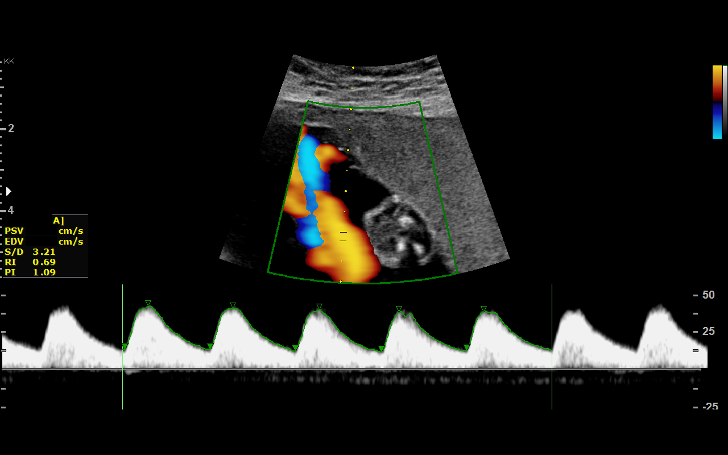
[im 12/13]
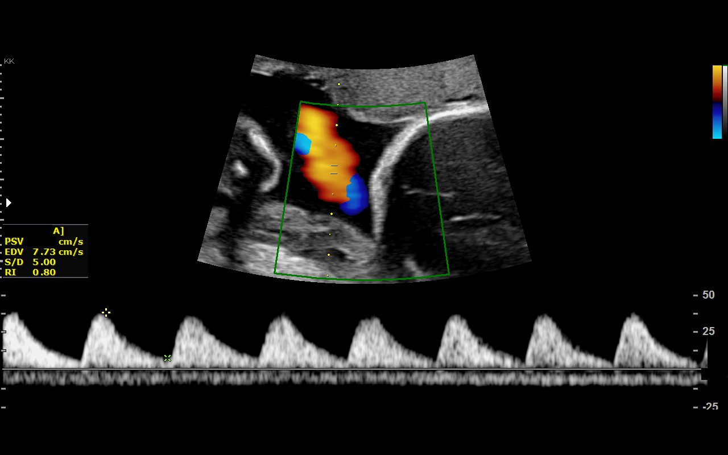
[im 13/13]
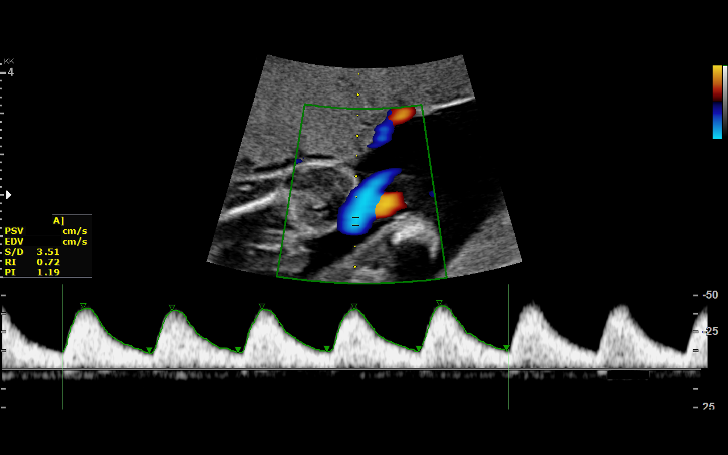

[13 of 13 positions shown; findings below may reference images not displayed]

[REDACTED]

  1  US MFM UA CORD DOPPLER               76820.02     SAHAGUN
                                                       HIRANTHA
 ----------------------------------------------------------------------

 ----------------------------------------------------------------------
Indications

  Maternal care for known or suspected poor
  fetal growth, second trimester, not applicable
  or unspecified
  Previous cesarean delivery, antepartum
  Poor obstetrical history (PPROM, Term
  delivery @ 38 weeks)
  Poor obstetric history: Previous
  preeclampsia / eclampsia/gestational HTN
  Poor obstetric history: Previous fetal growth
  restriction (FGR) per patient in [REDACTED]
  24 weeks gestation of pregnancy
 ----------------------------------------------------------------------
Vital Signs

 BMI:
Fetal Evaluation

 Num Of Fetuses:         1
 Fetal Heart Rate(bpm):  149
 Cardiac Activity:       Observed
 Presentation:           Cephalic
 Placenta:               Anterior
 P. Cord Insertion:      Previously Visualized

 Amniotic Fluid
 AFI FV:      Within normal limits
OB History

 Gravidity:    3         Term:   1        Prem:   0        SAB:   0
 TOP:          0       Ectopic:  1        Living: 1
Gestational Age

 LMP:           24w 2d        Date:  01/06/18                 EDD:   10/13/18
 Best:          24w 2d     Det. By:  LMP  (01/06/18)          EDD:   10/13/18
Doppler - Fetal Vessels

 Umbilical Artery
  S/D     %tile     RI              PI                     ADFV    RDFV
 4.42       84   0.77             1.32                        No      No

Impression

 Patient with suspected fetal growth restriction and abnormal
 umbilical artery Doppler study returned for Doppler studies.
 Amniotic fluid is normal and good fetal activity is seen.
 Umbilical artery Doppler showed normal forward diastolic
 flow. Her blood pressure at our office is 114/67 mm Hg.
 I discussed the findings with help of Spanish language
 interpreter.
Recommendations

 -An appointment was made for her to return in 2 weeks for
 fetal growth and umbilical artery Doppler study.
                 ARMAS

## 2021-04-27 ENCOUNTER — Other Ambulatory Visit: Payer: Self-pay

## 2021-04-27 DIAGNOSIS — N6325 Unspecified lump in the left breast, overlapping quadrants: Secondary | ICD-10-CM

## 2021-06-30 ENCOUNTER — Ambulatory Visit
Admission: RE | Admit: 2021-06-30 | Discharge: 2021-06-30 | Disposition: A | Discharge status: Home / Self Care | Payer: No Typology Code available for payment source | Source: Ambulatory Visit | Attending: Obstetrics and Gynecology | Admitting: Obstetrics and Gynecology

## 2021-06-30 ENCOUNTER — Encounter (INDEPENDENT_AMBULATORY_CARE_PROVIDER_SITE_OTHER): Payer: Self-pay

## 2021-06-30 ENCOUNTER — Ambulatory Visit
Admission: RE | Admit: 2021-06-30 | Discharge: 2021-06-30 | Disposition: A | Discharge status: Home / Self Care | Payer: Self-pay | Source: Ambulatory Visit | Attending: Obstetrics and Gynecology | Admitting: Obstetrics and Gynecology

## 2021-06-30 ENCOUNTER — Ambulatory Visit: Payer: Self-pay | Admitting: *Deleted

## 2021-06-30 VITALS — BP 140/92 | Wt 140.8 lb

## 2021-06-30 DIAGNOSIS — N644 Mastodynia: Secondary | ICD-10-CM

## 2021-06-30 DIAGNOSIS — Z1239 Encounter for other screening for malignant neoplasm of breast: Secondary | ICD-10-CM

## 2021-06-30 DIAGNOSIS — N6321 Unspecified lump in the left breast, upper outer quadrant: Secondary | ICD-10-CM

## 2021-06-30 DIAGNOSIS — N6325 Unspecified lump in the left breast, overlapping quadrants: Secondary | ICD-10-CM

## 2021-06-30 NOTE — Patient Instructions (Signed)
Explained breast self awareness with Cherly Anderson. Patient did not need a Pap smear today due to last Pap smear and HPV typing was 09/24/2020. Next Pap smear is due in August 2023. Appointment scheduled at the free cervical cancer screening Tuesday, September 27, 2021 at 0845. Referred patient to the [facility] for a diagnostic mammogram. Appointment scheduled Thursday, Jun 30, 2021 at 0940. Patient aware of appointments and will be there. Cherly Anderson verbalized understanding.  Vanellope Passmore, Kathaleen Maser, RN 8:58 AM

## 2021-06-30 NOTE — Progress Notes (Signed)
Emily Wiley is a 36 y.o. female who presents to [facility] today with complaint of two left breast lumps and pain since July 2022. Patient stated the pain is only when touched. Patient rates the pain at a 4 out of 10.    Pap Smear: Pap smear not completed today. Last Pap smear was 09/24/2020 at the [facility] and was normal with negative HPV. 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 Last Pap smear result is available in Epic.   Physical exam: Breasts Breasts symmetrical. No skin abnormalities bilateral breasts. No nipple retraction bilateral breasts. No nipple discharge bilateral breasts. No lymphadenopathy. No lumps palpated right breast. Palpated a bb sized lump within the left breast at 1 o'clock 1 cm from the nipple. Complaints of left outer breast pain on exam.       Pelvic/Bimanual Pap is not indicated today per BCCCP guidelines. Next Pap smear is due in August 2023. Appointment scheduled at the free cervical cancer screening Tuesday, September 27, 2021 at [facility].   Smoking History: Patient has never smoked.   Patient Navigation: Patient education provided. Access to services provided for patient through [facility] program. Spanish interpreter Rudene Anda from [facility] provided.    Breast and Cervical Cancer Risk Assessment: Patient does not have family history of breast cancer, known genetic mutations, or radiation treatment to the chest before age 24. Patient does not have history of cervical dysplasia, immunocompromised, or DES exposure in-utero. Breast cancer risk assessment completed. No breast cancer risk calculated due to patient is less than 43 years old.  Risk Assessment     Risk Scores       06/30/2021   Last edited by: Demetrius Revel, LPN   5-year risk:    Lifetime risk:             A: BCCCP exam without pap smear Complaint  of two left breast lumps and pain.  P: Referred patient to the [facility] for a diagnostic mammogram. Appointment scheduled Thursday, Jun 30, 2021 at [place].  Emily Parish, RN 06/30/2021 8:58 AM

## 2021-09-27 ENCOUNTER — Ambulatory Visit: Payer: Self-pay

## 2021-10-18 ENCOUNTER — Ambulatory Visit: Payer: Self-pay

## 2021-11-15 ENCOUNTER — Ambulatory Visit: Payer: Self-pay | Admitting: *Deleted

## 2021-11-15 VITALS — BP 130/82 | Wt 145.6 lb

## 2021-11-15 DIAGNOSIS — Z01419 Encounter for gynecological examination (general) (routine) without abnormal findings: Secondary | ICD-10-CM

## 2021-11-15 NOTE — Patient Instructions (Signed)
Let Emily Wiley know that if today's Pap smear is normal and HPV negative that her next Pap smear will be due in one year due to her history. Let patient know will follow up with her within the next couple weeks with results of Pap smear by phone. Emily Wiley verbalized understanding.  Hazen Brumett, Arvil Chaco, RN 9:40 AM

## 2021-11-15 NOTE — Progress Notes (Signed)
[name] is a 36 y.o. [id number] female who presents to [facility] clinic today with no complaints.   MS DIGITAL DIAG TOMO BILAT  Result Date: 06/30/2021 CLINICAL DATA:  Palpable lump in the left breast EXAM: DIGITAL DIAGNOSTIC BILATERAL MAMMOGRAM WITH TOMOSYNTHESIS AND CAD; ULTRASOUND LEFT BREAST LIMITED TECHNIQUE: Bilateral digital diagnostic mammography and breast tomosynthesis was performed. The images were evaluated with computer-aided detection.; Targeted ultrasound examination of the left breast was performed. COMPARISON:  None available. ACR Breast Density Category c: The breast tissue is heterogeneously dense, which may obscure small masses. FINDINGS: A skin lesions is identified adjacent to the BB marking the palpable lump on the left CC view. No suspicious masses, calcifications, or distortion are identified in either breast. Targeted ultrasound is performed, showing no sonographic abnormalities in the region the patient's palpable lump. IMPRESSION: No mammographic or sonographic evidence of malignancy. RECOMMENDATION: Annual screening mammography beginning at the age of 51. Treatment of the patient's symptoms should be based on clinical and physical exam given lack of imaging findings. I have discussed the findings and recommendations with the patient. If applicable, a reminder letter will be sent to the patient regarding the next appointment. BI-RADS CATEGORY  2: Benign. Electronically Signed   By: Dorise Bullion III M.D.   On: 06/30/2021 11:45    Pap Smear: Pap smear completed today. Last Pap smear was 09/24/2020 at the [facility] and was normal with negative HPV. 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 Patient stated her last Pap smear is the only Pap smear she has had completed since colposcopy. Last Pap smear result is available in Epic.    Pelvic/Bimanual Ext Genitalia No lesions, no swelling and no discharge observed on external genitalia.        Vagina Vagina pink and normal texture. No lesions or discharge observed in vagina.        Cervix Cervix is present. Cervix pink and of normal texture. Cervix slightly reddened above os. No discharge observed.    Uterus Uterus is present and palpable. Uterus in normal position and normal size.        Adnexae Bilateral ovaries present and palpable. No tenderness on palpation.         Rectovaginal No rectal exam completed today since patient had no rectal complaints. No skin abnormalities observed on exam.     Smoking History: Patient has never smoked.   Patient Navigation: Patient education provided. Access to services provided for patient through [facility] program. Spanish interpreter Geraldo Pitter from [facility] provided. Bus pass given to patient to get home from appointment.   Breast and Cervical Cancer Risk Assessment: Patient does not have family history of breast cancer, known genetic mutations, or radiation treatment to the chest before age 14. Patient has history of cervical dysplasia. Patient has no history of being immunocompromised or DES exposure in-utero. Breast cancer risk assessment completed. No breast cancer risk calculated due to patient is less than 58 years old.  Risk Assessment     Risk Scores       11/15/2021 06/30/2021   Last edited by: Royston Bake, CMA [place], Karlton Lemon, LPN   5-year risk:     Lifetime risk:              A: BCCCP exam with pap smear No complaints.   Loletta Parish, RN 11/15/2021 9:40 AM

## 2021-11-17 ENCOUNTER — Telehealth: Payer: Self-pay

## 2021-11-17 LAB — CYTOLOGY - PAP
Comment: NEGATIVE
Diagnosis: NEGATIVE
High risk HPV: NEGATIVE

## 2021-11-17 NOTE — Telephone Encounter (Signed)
Called patient via [employer] to give pap smear results. Informed patient that pap smear was normal and HPV was negative. Based on this result and her previous hx her next pap smear will be due in 1 year. Patient voiced understanding.

## 2022-09-29 ENCOUNTER — Ambulatory Visit
Admission: RE | Admit: 2022-09-29 | Discharge: 2022-09-29 | Disposition: A | Discharge status: Home / Self Care | Payer: Self-pay | Source: Ambulatory Visit | Attending: Internal Medicine | Admitting: Internal Medicine

## 2022-09-29 VITALS — BP 159/99 | HR 74 | Temp 97.4°F | Resp 16

## 2022-09-29 DIAGNOSIS — G43009 Migraine without aura, not intractable, without status migrainosus: Secondary | ICD-10-CM

## 2022-09-29 LAB — POCT URINALYSIS DIP (MANUAL ENTRY)
Bilirubin, UA: NEGATIVE
Glucose, UA: NEGATIVE mg/dL
Ketones, POC UA: NEGATIVE mg/dL
Leukocytes, UA: NEGATIVE
Nitrite, UA: NEGATIVE
Protein Ur, POC: NEGATIVE mg/dL
Spec Grav, UA: >=1.03 — AB (ref 1.010–1.025)
Urobilinogen, UA: 0.2 E.U./dL
pH, UA: 6 (ref 5.0–8.0)

## 2022-09-29 LAB — POCT URINE PREGNANCY: Preg Test, Ur: NEGATIVE

## 2022-09-29 MED ORDER — DEXAMETHASONE SODIUM PHOSPHATE 10 MG/ML IJ SOLN
10.0000 mg | Freq: Once | INTRAMUSCULAR | Status: AC
  Administered 2022-09-29: 10 mg via INTRAMUSCULAR

## 2022-09-29 MED ORDER — KETOROLAC TROMETHAMINE 30 MG/ML IJ SOLN
30.0000 mg | Freq: Once | INTRAMUSCULAR | Status: AC
  Administered 2022-09-29: 30 mg via INTRAMUSCULAR

## 2022-09-29 MED ORDER — ONDANSETRON 4 MG PO TBDP
4.0000 mg | ORAL_TABLET | Freq: Once | ORAL | Status: AC
  Administered 2022-09-29: 4 mg via ORAL

## 2022-09-29 NOTE — Discharge Instructions (Addendum)
You were given a migraine cocktail in urgent care to help alleviate headache.  If headache does not improve, please go straight to the emergency department.  Do not take any ibuprofen, Advil, Aleve for at least 24 hours following injection.

## 2022-09-29 NOTE — ED Triage Notes (Signed)
Pt presents to the office for headache vomiting that started yesterday.

## 2022-09-29 NOTE — ED Provider Notes (Addendum)
[facility]    CSN: [id number] Arrival date & time: 09/29/22  1400      History   Chief Complaint Chief Complaint  Patient presents with   Headache    Entered by patient    HPI Emily Wiley is a 37 y.o. female.   Patient presents today with a headache that has been present since last night.  Patient reports that headache is present in bilateral sides of head.  Currently rates it 9/10 on pain scale but denies any dizziness, chest pain, shortness of breath.  Reports that she did have 1 episode of nonbloody emesis this morning but has had persistent nausea.  Last menstrual cycle was 09/25/2022.  Patient reports that she has had 3 episodes of similar headache for the past few months with last 1 being approximately 1 month ago.  Patient has taken Tylenol for headache with minimal improvement.  Patient denies any recent falls or head trauma.   Headache   Past Medical History:  Diagnosis Date   Pregnancy induced hypertension     Patient Active Problem List   Diagnosis Date Noted   OB Hemorrhage 10/10/2018   Pre-eclampsia, severe, antepartum, third trimester 10/08/2018   Encounter for induction of labor 10/08/2018   Language barrier 09/16/2018   Supervision of high risk pregnancy, antepartum 04/08/2018   History of cesarean delivery 04/08/2018   Hx of preeclampsia, prior pregnancy, currently pregnant, second trimester 04/08/2018   History of preterm premature rupture of membranes (PPROM) 04/08/2018    Past Surgical History:  Procedure Laterality Date   CESAREAN SECTION      OB History     Gravida  3   Para  2   Term  2   Preterm  0   AB  1   Living  2      SAB      IAB      Ectopic  1   Multiple  0   Live Births  2            Home Medications    Prior to Admission medications   Medication Sig Start Date End Date Taking? Authorizing Provider  acetaminophen (TYLENOL) 325 MG tablet Take 650 mg by mouth every 6 (six) hours  as needed. Patient not taking: Reported on 11/15/2021    [provider]  aspirin 81 MG chewable tablet Chew 1 tablet (81 mg total) by mouth daily. 04/08/18   Allie Bossier, MD  Elastic Bandages & Supports (ABDOMINAL SUPPORT/L-XL) MISC 1 each by Does not apply route as needed. Patient not taking: Reported on 10/15/2018 08/05/18   Hermina Staggers, MD  ibuprofen (ADVIL) 600 MG tablet Take 1 tablet (600 mg total) by mouth every 6 (six) hours. Patient not taking: Reported on 11/12/2018 10/11/18   Reva Bores, MD  labetalol (NORMODYNE) 200 MG tablet Take 1 tablet (200 mg total) by mouth 2 (two) times daily. Patient not taking: Reported on 11/15/2021 10/15/18   Judeth Horn, NP  lisinopril (PRINIVIL) 10 MG tablet Take 1 tablet (10 mg total) by mouth daily. Patient not taking: Reported on 11/15/2021 10/11/18   Reva Bores, MD  Prenatal Vit-Fe Fumarate-FA (PRENATAL PO) Take 1 tablet by mouth daily.  Patient not taking: Reported on 11/15/2021    [provider]    Family History Family History  Problem Relation Age of Onset   Hypertension Mother    Hypertension Father     Social History Social History  Tobacco Use   Smoking status: Never   Smokeless tobacco: Never  Vaping Use   Vaping status: Never Used  Substance Use Topics   Alcohol use: Yes    Comment: occasionally   Drug use: Never     Allergies   Patient has no known allergies.   Review of Systems Review of Systems Per HPI  Physical Exam Triage Vital Signs ED Triage Vitals [09/29/22 1445]  Encounter Vitals Group     BP (!) 159/99     Systolic BP Percentile      Diastolic BP Percentile      Pulse Rate 74     Resp 16     Temp (!) 97.4 F (36.3 C)     Temp Source Oral     SpO2 99 %     Weight      Height      Head Circumference      Peak Flow      Pain Score      Pain Loc      Pain Education      Exclude from Growth Chart    No data found.  Updated Vital Signs BP (!) 159/99 (BP Location:  Left Arm)   Pulse 74   Temp (!) 97.4 F (36.3 C) (Oral)   Resp 16   LMP 09/25/2022 (Exact Date)   SpO2 99%   Visual Acuity Right Eye Distance:   Left Eye Distance:   Bilateral Distance:    Right Eye Near:   Left Eye Near:    Bilateral Near:     Physical Exam Constitutional:      General: She is not in acute distress.    Appearance: Normal appearance. She is not toxic-appearing or diaphoretic.  HENT:     Head: Normocephalic and atraumatic.  Eyes:     Extraocular Movements: Extraocular movements intact.     Conjunctiva/sclera: Conjunctivae normal.     Pupils: Pupils are equal, round, and reactive to light.  Cardiovascular:     Rate and Rhythm: Normal rate and regular rhythm.     Pulses: Normal pulses.     Heart sounds: Normal heart sounds.  Pulmonary:     Effort: Pulmonary effort is normal. No respiratory distress.     Breath sounds: Normal breath sounds.  Neurological:     General: No focal deficit present.     Mental Status: She is alert and oriented to person, place, and time. Mental status is at baseline.     Cranial Nerves: Cranial nerves 2-12 are intact.     Sensory: Sensation is intact.     Motor: Motor function is intact.     Coordination: Coordination is intact.     Gait: Gait is intact.  Psychiatric:        Mood and Affect: Mood normal.        Behavior: Behavior normal.        Thought Content: Thought content normal.        Judgment: Judgment normal.      UC Treatments / Results  Labs (all labs ordered are listed, but only abnormal results are displayed) Labs Reviewed  POCT URINALYSIS DIP (MANUAL ENTRY) - Abnormal; Notable for the following components:      Result Value   Spec Grav, UA >=1.030 (*)    Blood, UA small (*)    All other components within normal limits  POCT URINE PREGNANCY    EKG   Radiology No results found.  Procedures Procedures (  including critical care time)  Medications Ordered in UC Medications  ketorolac (TORADOL)  30 MG/ML injection 30 mg (has no administration in time range)  dexamethasone (DECADRON) injection 10 mg (has no administration in time range)  ondansetron (ZOFRAN-ODT) disintegrating tablet 4 mg (has no administration in time range)    Initial Impression / Assessment and Plan / UC Course  I have reviewed the triage vital signs and the nursing notes.  Pertinent labs & imaging results that were available during my care of the patient were reviewed by me and considered in my medical decision making (see chart for details).     I suspect the patient is having migraine headaches.  Given no recent head trauma and that neuroexam is normal, do not think that emergent evaluation or imaging of the head is necessary at this time.  Blood pressure slightly elevated but could be due to pain.  Although, blood pressure could be causing headache and this is difficult to determine on physical exam.  Recommended to patient that she monitor her blood pressure very closely over the next few days and follow-up if it remains elevated.  Will treat with migraine cocktail with IM Toradol, IM Decadron, oral Zofran.  She denies that she takes any daily medications so this should be safe. Advised no NSAIDs for at least 24 hours following injection.  Advised patient that if headache does not improve over the next 24 to 48 hours, she is to go straight to the emergency department for further evaluation and management.  Also advised strict follow-up with PCP as she may need neurology referral given recurrent headaches.  Patient verbalized understanding and was agreeable with plan.  Interpreter used throughout patient interaction. Final Clinical Impressions(s) / UC Diagnoses   Final diagnoses:  Migraine without aura and without status migrainosus, not intractable     Discharge Instructions      You were given a migraine cocktail in urgent care to help alleviate headache.  If headache does not improve, please go straight to  the emergency department.  Do not take any ibuprofen, Advil, Aleve for at least 24 hours following injection.    ED Prescriptions   None    PDMP not reviewed this encounter.   Gustavus Bryant, [place] 09/29/22 1530    Gustavus Bryant, [place] 09/29/22 [phone]

## 2022-12-05 ENCOUNTER — Other Ambulatory Visit: Payer: Self-pay | Admitting: Hematology and Oncology

## 2022-12-05 DIAGNOSIS — Z124 Encounter for screening for malignant neoplasm of cervix: Secondary | ICD-10-CM

## 2022-12-05 NOTE — Progress Notes (Signed)
Patient: Emily Wiley           Date of Birth: 09/10/1985           MRN: [id number] Visit Date: 12/05/2022 PCP: Patient, No Pcp Per  Cervical Cancer Screening Do you smoke?: No Have you ever had or been told you have an allergy to latex products?: Yes Marital status: Married Date of last pap smear: 1-2 yrs ago Date of last menstrual period: 11/22/22 Number of pregnancies: 3 Number of births: 2 Have you ever had any of the following? Hysterectomy: No Tubal ligation (tubes tied): No Abnormal bleeding: No Abnormal pap smear: Yes (In 2020) Venereal warts: No A sex partner with venereal warts: No A high risk* sex partner: No  Cervical Exam Pap smear completed: Pap test Abnormal Observations: Normal Recommendations: Repeat in 1 year even if normal and HPV- due to previous abnormal Pap smear. She will need 3 normal in a row. Most recent 11/15/21 normal (1 of 3).       Patient's History Patient Active Problem List   Diagnosis Date Noted   OB Hemorrhage 10/10/2018   Pre-eclampsia, severe, antepartum, third trimester 10/08/2018   Encounter for induction of labor 10/08/2018   Language barrier 09/16/2018   Supervision of high risk pregnancy, antepartum 04/08/2018   History of cesarean delivery 04/08/2018   Hx of preeclampsia, prior pregnancy, currently pregnant, second trimester 04/08/2018   History of preterm premature rupture of membranes (PPROM) 04/08/2018   Past Medical History:  Diagnosis Date   Pregnancy induced hypertension     Family History  Problem Relation Age of Onset   Hypertension Mother    Hypertension Father     Social History   Occupational History   Not on file  Tobacco Use   Smoking status: Never   Smokeless tobacco: Never  Vaping Use   Vaping status: Never Used  Substance and Sexual Activity   Alcohol use: Yes    Comment: occasionally   Drug use: Never   Sexual activity: Yes    Birth control/protection: Condom

## 2022-12-08 LAB — CYTOLOGY - PAP
Adequacy: ABSENT
Comment: NEGATIVE
Diagnosis: NEGATIVE
High risk HPV: NEGATIVE
